# Patient Record
Sex: Female | Born: 1950 | Race: Black or African American | Hispanic: No | Marital: Married | Smoking: Never smoker
Health system: Southern US, Community
[De-identification: ages and names within clinical notes are randomized; demographics above are authoritative.]

## PROBLEM LIST (undated history)

## (undated) DIAGNOSIS — E785 Hyperlipidemia, unspecified: Secondary | ICD-10-CM

## (undated) DIAGNOSIS — I10 Essential (primary) hypertension: Secondary | ICD-10-CM

## (undated) DIAGNOSIS — E119 Type 2 diabetes mellitus without complications: Secondary | ICD-10-CM

---

## 2004-11-04 ENCOUNTER — Emergency Department: Payer: Self-pay | Admitting: Emergency Medicine

## 2004-11-04 ENCOUNTER — Other Ambulatory Visit: Payer: Self-pay

## 2005-05-29 ENCOUNTER — Encounter: Payer: Self-pay | Admitting: Internal Medicine

## 2005-06-06 ENCOUNTER — Encounter: Payer: Self-pay | Admitting: Internal Medicine

## 2005-07-23 ENCOUNTER — Other Ambulatory Visit: Payer: Self-pay

## 2005-07-23 ENCOUNTER — Inpatient Hospital Stay: Payer: Self-pay | Admitting: Infectious Diseases

## 2005-07-25 ENCOUNTER — Other Ambulatory Visit: Payer: Self-pay

## 2005-07-26 ENCOUNTER — Other Ambulatory Visit: Payer: Self-pay

## 2006-03-25 ENCOUNTER — Emergency Department: Payer: Self-pay | Admitting: Emergency Medicine

## 2006-03-29 ENCOUNTER — Encounter: Payer: Self-pay | Admitting: Internal Medicine

## 2006-04-06 ENCOUNTER — Encounter: Payer: Self-pay | Admitting: Internal Medicine

## 2006-06-09 ENCOUNTER — Other Ambulatory Visit: Payer: Self-pay

## 2006-06-09 ENCOUNTER — Emergency Department: Payer: Self-pay | Admitting: Emergency Medicine

## 2006-06-11 ENCOUNTER — Ambulatory Visit: Payer: Self-pay | Admitting: Emergency Medicine

## 2006-08-22 ENCOUNTER — Other Ambulatory Visit: Payer: Self-pay

## 2006-08-22 ENCOUNTER — Emergency Department: Payer: Self-pay | Admitting: Emergency Medicine

## 2008-11-23 ENCOUNTER — Emergency Department: Payer: Self-pay

## 2010-06-11 ENCOUNTER — Emergency Department: Payer: Self-pay | Admitting: Unknown Physician Specialty

## 2010-09-18 ENCOUNTER — Emergency Department: Payer: Self-pay | Admitting: Emergency Medicine

## 2011-06-05 ENCOUNTER — Emergency Department: Payer: Self-pay | Admitting: Emergency Medicine

## 2015-11-20 ENCOUNTER — Emergency Department: Payer: Medicare HMO

## 2015-11-20 ENCOUNTER — Emergency Department
Admission: EM | Admit: 2015-11-20 | Discharge: 2015-11-20 | Disposition: A | Discharge status: Home / Self Care | Payer: Medicare HMO | Attending: Emergency Medicine | Admitting: Emergency Medicine

## 2015-11-20 ENCOUNTER — Encounter: Payer: Self-pay | Admitting: Emergency Medicine

## 2015-11-20 DIAGNOSIS — M5412 Radiculopathy, cervical region: Secondary | ICD-10-CM | POA: Insufficient documentation

## 2015-11-20 DIAGNOSIS — I10 Essential (primary) hypertension: Secondary | ICD-10-CM | POA: Insufficient documentation

## 2015-11-20 DIAGNOSIS — E119 Type 2 diabetes mellitus without complications: Secondary | ICD-10-CM | POA: Diagnosis not present

## 2015-11-20 DIAGNOSIS — M542 Cervicalgia: Secondary | ICD-10-CM | POA: Diagnosis present

## 2015-11-20 HISTORY — DX: Essential (primary) hypertension: I10

## 2015-11-20 HISTORY — DX: Type 2 diabetes mellitus without complications: E11.9

## 2015-11-20 HISTORY — DX: Hyperlipidemia, unspecified: E78.5

## 2015-11-20 LAB — CBC
HCT: 39.2 % (ref 35.0–47.0)
Hemoglobin: 12.9 g/dL (ref 12.0–16.0)
MCH: 29.3 pg (ref 26.0–34.0)
MCHC: 33 g/dL (ref 32.0–36.0)
MCV: 88.7 fL (ref 80.0–100.0)
PLATELETS: 247 10*3/uL (ref 150–440)
RBC: 4.42 MIL/uL (ref 3.80–5.20)
RDW: 14.2 % (ref 11.5–14.5)
WBC: 5.7 10*3/uL (ref 3.6–11.0)

## 2015-11-20 LAB — BASIC METABOLIC PANEL
ANION GAP: 8 (ref 5–15)
BUN: 27 mg/dL — ABNORMAL HIGH (ref 6–20)
CALCIUM: 9.4 mg/dL (ref 8.9–10.3)
CO2: 28 mmol/L (ref 22–32)
Chloride: 106 mmol/L (ref 101–111)
Creatinine, Ser: 0.88 mg/dL (ref 0.44–1.00)
GFR calc Af Amer: >60 mL/min (ref >60)
GLUCOSE: 155 mg/dL — AB (ref 65–99)
Potassium: 3.9 mmol/L (ref 3.5–5.1)
SODIUM: 142 mmol/L (ref 135–145)

## 2015-11-20 LAB — TROPONIN I: Troponin I: <0.03 ng/mL (ref <0.031)

## 2015-11-20 MED ORDER — KETOROLAC TROMETHAMINE 30 MG/ML IJ SOLN
15.0000 mg | Freq: Once | INTRAMUSCULAR | Status: AC
  Administered 2015-11-20: 15 mg via INTRAVENOUS
  Filled 2015-11-20: qty 1

## 2015-11-20 MED ORDER — LORAZEPAM 2 MG/ML IJ SOLN
0.5000 mg | Freq: Once | INTRAMUSCULAR | Status: AC
  Administered 2015-11-20: 0.5 mg via INTRAVENOUS
  Filled 2015-11-20: qty 1

## 2015-11-20 MED ORDER — OXYCODONE-ACETAMINOPHEN 5-325 MG PO TABS: 2.0000 | ORAL_TABLET | Freq: Four times a day (QID) | ORAL | Status: DC | PRN

## 2015-11-20 MED ORDER — DEXAMETHASONE SODIUM PHOSPHATE 10 MG/ML IJ SOLN
10.0000 mg | Freq: Once | INTRAMUSCULAR | Status: AC
  Administered 2015-11-20: 10 mg via INTRAVENOUS
  Filled 2015-11-20: qty 1

## 2015-11-20 MED ORDER — PREDNISONE 10 MG (21) PO TBPK: 10.0000 mg | ORAL_TABLET | Freq: Every day | ORAL | Status: DC

## 2015-11-20 MED ORDER — HYDROMORPHONE HCL 1 MG/ML IJ SOLN
0.5000 mg | Freq: Once | INTRAMUSCULAR | Status: AC
  Administered 2015-11-20: 0.5 mg via INTRAVENOUS
  Filled 2015-11-20: qty 1

## 2015-11-20 MED ORDER — HYDROMORPHONE HCL 1 MG/ML IJ SOLN
0.5000 mg | Freq: Once | INTRAMUSCULAR | Status: AC
  Administered 2015-11-20: 0.5 mg via INTRAVENOUS

## 2015-11-20 NOTE — Discharge Instructions (Signed)

## 2015-11-20 NOTE — ED Provider Notes (Signed)
[facility] Emergency Department Provider Note     Time seen: ----------------------------------------- 8:29 AM on 11/20/2015 -----------------------------------------    I have reviewed the triage vital signs and the nursing notes.   HISTORY  Chief Complaint Arm Pain    HPI Rebecca Fowler is a 65 y.o. female brought to the ER for right arm pain for last month. Patient states he got worse last night when she slept on it reports shortness of breath started this morning. Patient states the pain starts in the right side of her neck and radiates down particularly to her second and third digits on her right hand. Pain increases with movement of the right arm and neck. She also reports pain in the right side of her chest intermittently over the last month. Nothing makes her symptoms better, she seen her primary care doctor who told her was probably radiculopathy.   Past Medical History  Diagnosis Date  . Hyperlipemia   . Diabetes mellitus without complication ([place])   . Hypertension     There are no active problems to display for this patient.   History reviewed. No pertinent past surgical history.  Allergies Review of patient's allergies indicates no known allergies.  Social History Social History  Substance Use Topics  . Smoking status: Never Smoker   . Smokeless tobacco: None  . Alcohol Use: No    Review of Systems Constitutional: Negative for fever. Eyes: Negative for visual changes. ENT: Negative for sore throat. Cardiovascular: Negative for chest pain. Respiratory: Negative for shortness of breath. Gastrointestinal: Negative for abdominal pain, vomiting and diarrhea. Genitourinary: Negative for dysuria. Musculoskeletal: Positive for radicular right arm pain Skin: Negative for rash. Neurological: Negative for headaches, focal weakness or numbness.  10-point ROS otherwise  negative.  ____________________________________________   PHYSICAL EXAM:  VITAL SIGNS: ED Triage Vitals  Enc Vitals Group     BP 11/20/15 0814 154/77 mmHg     Pulse Rate 11/20/15 0814 85     Resp 11/20/15 0814 16     Temp --      Temp src --      SpO2 11/20/15 0814 98 %     Weight --      Height --      Head Cir --      Peak Flow --      Pain Score 11/20/15 0752 9     Pain Loc --      Pain Edu? --      Excl. in [place]? --     Constitutional: Alert and oriented. Well appearing and in no distress. Eyes: Conjunctivae are normal. PERRL. Normal extraocular movements. ENT   Head: Normocephalic and atraumatic.   Nose: No congestion/rhinnorhea.   Mouth/Throat: Mucous membranes are moist.   Neck: No stridor. Cardiovascular: Normal rate, regular rhythm. Normal and symmetric distal pulses are present in all extremities. No murmurs, rubs, or gallops. Respiratory: Normal respiratory effort without tachypnea nor retractions. Breath sounds are clear and equal bilaterally. No wheezes/rales/rhonchi. Gastrointestinal: Soft and nontender. No distention. No abdominal bruits.  Musculoskeletal: Mild pain with range of motion right shoulder, patient describes C7 radiculopathy Neurologic:  Normal speech and language. No gross focal neurologic deficits are appreciated. Speech is normal. No gait instability. Patient describes C7 radiculopathy in the right hand and arm over the dorsal aspect Skin:  Skin is warm, dry and intact. No rash noted. Psychiatric: Mood and affect are normal. Speech and behavior are normal. Patient exhibits appropriate insight and judgment. ____________________________________________  EKG: Interpreted by me. Normal sinus rhythm with a rate of 93 bpm, normal PR interval, normal QRS, normal QT interval. Leftward axis. No evidence of acute infarction  ____________________________________________  ED COURSE:  Pertinent labs & imaging results that were available  during my care of the patient were reviewed by me and considered in my medical decision making (see chart for details). Patient does describe a C7 radiculopathy in the right side. I will evaluate her chest and C-spine. We will also check cardiac labs ____________________________________________    LABS (pertinent positives/negatives)  Labs Reviewed  BASIC METABOLIC PANEL - Abnormal; Notable for the following:    Glucose, Bld 155 (*)    BUN 27 (*)    All other components within normal limits  TROPONIN I  CBC    RADIOLOGY Images were viewed by me  Chest x-ray, C-spine CT  IMPRESSION: 1. Negative for fracture or other acute bone abnormality. 2. Loss of the normal cervical spine lordosis, which may be secondary to positioning, spasm, or soft tissue injury. 3. Multilevel spondylitic changes and disc protrusions as above. 4. Nonspecific lucent lesions in several vertebral bodies. Correlate with any clinical or laboratory evidence of multiple myeloma. 5. Thyroid nodules, incompletely characterized. 6. Bilateral carotid bifurcation plaque. IMPRESSION: No active cardiopulmonary disease. ____________________________________________  FINAL ASSESSMENT AND PLAN  Cervical radiculopathy  Plan: Patient with labs and imaging as dictated above. Patient is feeling better after pain medicine and steroids here. CT of the C-spine does show multilevel degenerative disc disease. She will need to follow-up with her doctor to have an MRI. We'll place her on steroid taper given Percocet for pain. She is advised to use Colace to prevent constipation.   Earleen Newport, MD   Earleen Newport, MD 11/20/15 [phone]

## 2015-11-20 NOTE — ED Notes (Addendum)
Pt to ed with c/o right arm pain x 1 month.  Pt states it got worse last night after she slept on it and reports sob that started this am.  Pt states pain starts in right neck and radiates down into right hand with numbness in right hand. Pt reports increased pain with movement of right arm or neck.  Also reports pain in right side of chest intermittently over the last month.

## 2021-10-25 ENCOUNTER — Other Ambulatory Visit: Payer: Self-pay

## 2021-10-25 ENCOUNTER — Emergency Department: Payer: Medicare PPO

## 2021-10-25 ENCOUNTER — Emergency Department
Admission: EM | Admit: 2021-10-25 | Discharge: 2021-10-25 | Disposition: A | Discharge status: Home / Self Care | Payer: Medicare PPO | Attending: Emergency Medicine | Admitting: Emergency Medicine

## 2021-10-25 ENCOUNTER — Encounter: Payer: Self-pay | Admitting: Emergency Medicine

## 2021-10-25 ENCOUNTER — Emergency Department (HOSPITAL_COMMUNITY): Payer: Medicare HMO

## 2021-10-25 DIAGNOSIS — E119 Type 2 diabetes mellitus without complications: Secondary | ICD-10-CM | POA: Diagnosis not present

## 2021-10-25 DIAGNOSIS — M5431 Sciatica, right side: Secondary | ICD-10-CM | POA: Diagnosis not present

## 2021-10-25 DIAGNOSIS — Z7982 Long term (current) use of aspirin: Secondary | ICD-10-CM | POA: Diagnosis not present

## 2021-10-25 DIAGNOSIS — M549 Dorsalgia, unspecified: Secondary | ICD-10-CM | POA: Diagnosis present

## 2021-10-25 DIAGNOSIS — I10 Essential (primary) hypertension: Secondary | ICD-10-CM | POA: Diagnosis not present

## 2021-10-25 DIAGNOSIS — Z79899 Other long term (current) drug therapy: Secondary | ICD-10-CM | POA: Insufficient documentation

## 2021-10-25 DIAGNOSIS — Z7984 Long term (current) use of oral hypoglycemic drugs: Secondary | ICD-10-CM | POA: Diagnosis not present

## 2021-10-25 MED ORDER — DEXAMETHASONE SODIUM PHOSPHATE 10 MG/ML IJ SOLN
10.0000 mg | Freq: Once | INTRAMUSCULAR | Status: AC
  Administered 2021-10-25: 22:00:00 10 mg via INTRAMUSCULAR
  Filled 2021-10-25: qty 1

## 2021-10-25 MED ORDER — METHOCARBAMOL 500 MG PO TABS
500.0000 mg | ORAL_TABLET | Freq: Four times a day (QID) | ORAL | 0 refills | Status: DC
Start: 2021-10-25 — End: 2021-10-28

## 2021-10-25 MED ORDER — PREDNISONE 10 MG PO TABS: 10.0000 mg | ORAL_TABLET | ORAL | 0 refills | Status: AC

## 2021-10-25 MED ORDER — ORPHENADRINE CITRATE 30 MG/ML IJ SOLN
60.0000 mg | Freq: Once | INTRAMUSCULAR | Status: AC
  Administered 2021-10-25: 22:00:00 60 mg via INTRAMUSCULAR
  Filled 2021-10-25: qty 2

## 2021-10-25 NOTE — ED Triage Notes (Signed)
Pt to ED via POV with c/o right lower back pain that radiates down to her right knee. It just began hurting before she came, denies any injury.

## 2021-10-25 NOTE — ED Provider Notes (Signed)
[facility] Emergency Department Provider Note  ____________________________________________  Time seen: Approximately 10:11 PM  I have reviewed the triage vital signs and the nursing notes.   HISTORY  Chief Complaint Back Pain and Leg Pain    HPI Rebecca DICKERSON is a 70 y.o. female who presents to the emergency department complaining of radiating pain from her right back.  She does have a history of back issues, states that she feels that she may have sciatica.  No recent trauma.  No urinary or GI complaints.  Patient denies any bowel or bladder dysfunction, saddle anesthesia or paresthesias.  Not alleviated with over-the-counter medications at home.       Past Medical History:  Diagnosis Date   Diabetes mellitus without complication (HCC)    Hyperlipemia    Hypertension     There are no problems to display for this patient.   History reviewed. No pertinent surgical history.  Prior to Admission medications   Medication Sig Start Date End Date Taking? Authorizing Provider  methocarbamol (ROBAXIN) 500 MG tablet Take 1 tablet (500 mg total) by mouth 4 (four) times daily. 10/25/21  Yes Jaleyah Longhi, Delorise Royals, PA-C  predniSONE (DELTASONE) 10 MG tablet Take 1 tablet (10 mg total) by mouth as directed. 10/25/21  Yes Shaaron Golliday, Delorise Royals, PA-C  amLODipine (NORVASC) 10 MG tablet Take 10 mg by mouth daily. 12/28/14   [provider]  aspirin 81 MG chewable tablet Chew 81 mg by mouth every morning.    [provider]  atorvastatin (LIPITOR) 10 MG tablet Take 10 mg by mouth 4 (four) times a week. 11/18/15   [provider]  chlorthalidone (HYGROTON) 25 MG tablet Take 25 mg by mouth every morning. 10/12/15   [provider]  gabapentin (NEURONTIN) 100 MG capsule Take 300 mg by mouth every evening. 11/08/15   [provider]  glimepiride (AMARYL) 4 MG tablet Take 4 mg by mouth 2 (two) times daily. 10/12/15   [provider]  hydrOXYzine (ATARAX/VISTARIL) 25 MG tablet Take 25 mg by mouth every 6 (six) hours as needed. 10/20/15   [provider]  losartan (COZAAR) 100 MG tablet Take 100 mg by mouth daily. 03/09/15   [provider]  metFORMIN (GLUCOPHAGE) 1000 MG tablet Take 1,000 mg by mouth 2 (two) times daily with a meal. 09/21/15   [provider]  metoprolol succinate (TOPROL-XL) 25 MG 24 hr tablet Take 25 mg by mouth daily. 09/21/15   [provider]  naproxen (NAPROSYN) 500 MG tablet Take 500 mg by mouth 2 (two) times daily as needed. For pain. 06/15/15   [provider]  oxyCODONE-acetaminophen (PERCOCET) 5-325 MG tablet Take 2 tablets by mouth every 6 (six) hours as needed for moderate pain or severe pain. 11/20/15   Emily Filbert, MD  traMADol (ULTRAM) 50 MG tablet Take 50 mg by mouth every 6 (six) hours as needed. For pain. 11/11/15   [provider]    Allergies Pravastatin and Simvastatin  No family history on file.  Social History Social History   Tobacco Use   Smoking status: Never  Substance Use Topics   Alcohol use: No   Drug use: No     Review of Systems  Constitutional: No fever/chills Eyes: No visual changes. No discharge ENT: No upper respiratory complaints. Cardiovascular: no chest pain. Respiratory: no cough. No SOB. Gastrointestinal: No abdominal pain.  No nausea, no vomiting.  No diarrhea.  No constipation. Musculoskeletal: Right lower  back pain radiating into right leg Skin: Negative for rash, abrasions, lacerations, ecchymosis. Neurological: Negative for headaches, focal weakness or numbness.  10 System ROS otherwise negative.  ____________________________________________   PHYSICAL EXAM:  VITAL SIGNS: ED Triage Vitals [10/25/21 2113]  Enc Vitals Group     BP (!) 132/58     Pulse Rate 81     Resp 20     Temp 98.9 F (37.2 C)     Temp Source Oral     SpO2 100 %     Weight 248 lb (112.5 kg)      Height 5' 7.5" (1.715 m)     Head Circumference      Peak Flow      Pain Score 10     Pain Loc      Pain Edu?      Excl. in [place]?      Constitutional: Alert and oriented. Well appearing and in no acute distress. Eyes: Conjunctivae are normal. PERRL. EOMI. Head: Atraumatic. ENT:      Ears:       Nose: No congestion/rhinnorhea.      Mouth/Throat: Mucous membranes are moist.  Neck: No stridor.    Cardiovascular: Normal rate, regular rhythm. Normal S1 and S2.  Good peripheral circulation. Respiratory: Normal respiratory effort without tachypnea or retractions. Lungs CTAB. Good air entry to the bases with no decreased or absent breath sounds. Musculoskeletal: Full range of motion to all extremities. No gross deformities appreciated.  No midline tenderness, patient is tender along the right paraspinal muscle group extending into the SI joint and sciatic notch.  No palpable abnormalities.  Examination of the knee and ankle is unremarkable.  Dorsalis pedis pulses sensation intact distally Neurologic:  Normal speech and language. No gross focal neurologic deficits are appreciated.  Skin:  Skin is warm, dry and intact. No rash noted. Psychiatric: Mood and affect are normal. Speech and behavior are normal. Patient exhibits appropriate insight and judgement.   ____________________________________________   LABS (all labs ordered are listed, but only abnormal results are displayed)  Labs Reviewed - No data to display ____________________________________________  EKG   ____________________________________________  RADIOLOGY I personally viewed and evaluated these images as part of my medical decision making, as well as reviewing the written report by the radiologist.  ED Provider Interpretation: Degenerative changes without acute findings  DG Lumbar Spine 2-3 Views  Result Date: 10/25/2021 CLINICAL DATA:  Lower back pain that radiates to the right knee. EXAM: LUMBAR SPINE - 2-3  VIEW COMPARISON:  None. FINDINGS: There is no evidence of lumbar spine fracture. Approximately 5 mm anterolisthesis of the L4 vertebral body is seen on L5. Mild multilevel endplate sclerosis is seen. Bilateral facet joint hypertrophy is seen, most prominent at the levels of L4-L5 and L5-S1. Intervertebral disc spaces are maintained. IMPRESSION: 1. 5 mm anterolisthesis of L4 on L5. 2. Multilevel degenerative changes, most prominent at L4-L5 and L5-S1. Electronically Signed   By: Aram Candela M.D.   On: 10/25/2021 21:51    ____________________________________________    PROCEDURES  Procedure(s) performed:    Procedures    Medications  dexamethasone (DECADRON) injection 10 mg (10 mg Intramuscular Given 10/25/21 2218)  orphenadrine (NORFLEX) injection 60 mg (60 mg Intramuscular Given 10/25/21 2218)     ____________________________________________   INITIAL IMPRESSION / ASSESSMENT AND PLAN / ED COURSE  Pertinent labs & imaging results that were available during my care of the patient were reviewed by me and considered in my medical decision  making (see chart for details).  Review of the  [place]RS was performed in accordance of the [facility] prior to dispensing any controlled drugs.           Patient's diagnosis is consistent with right-sided sciatica.  Patient presents emergency department with right lower back pain radiating down the right leg.  Patient has had no trauma.  No urinary or GI complaints.  Tender on exam along the paraspinal muscle group extending into the SI joint and sciatic notch consistent with sciatica.  Patient will be given steroids and muscle relaxer for symptom relief.  Follow-up primary care as needed.  Return precautions discussed with the patient.. Patient is given ED precautions to return to the ED for any worsening or new symptoms.     ____________________________________________  FINAL CLINICAL IMPRESSION(S) / ED DIAGNOSES  Final diagnoses:  Sciatica  of right side      NEW MEDICATIONS STARTED DURING THIS VISIT:  ED Discharge Orders          Ordered    predniSONE (DELTASONE) 10 MG tablet  As directed       Note to Pharmacy: Take on a pattern of 6, 6, 5, 5, 4, 4, 3, 3, 2, 2, 1, 1   10/25/21 2211    methocarbamol (ROBAXIN) 500 MG tablet  4 times daily        10/25/21 2211                This chart was dictated using voice recognition software/Dragon. Despite best efforts to proofread, errors can occur which can change the meaning. Any change was purely unintentional.    Racheal Patches, PA-C 10/25/21 2355    Minna Antis, MD 10/26/21 2258

## 2021-10-28 ENCOUNTER — Emergency Department: Payer: Medicare PPO

## 2021-10-28 ENCOUNTER — Emergency Department
Admission: EM | Admit: 2021-10-28 | Discharge: 2021-10-28 | Disposition: A | Discharge status: Home / Self Care | Payer: Medicare PPO | Attending: Emergency Medicine | Admitting: Emergency Medicine

## 2021-10-28 ENCOUNTER — Other Ambulatory Visit: Payer: Self-pay

## 2021-10-28 ENCOUNTER — Encounter: Payer: Self-pay | Admitting: Emergency Medicine

## 2021-10-28 DIAGNOSIS — Z7984 Long term (current) use of oral hypoglycemic drugs: Secondary | ICD-10-CM | POA: Diagnosis not present

## 2021-10-28 DIAGNOSIS — Z79899 Other long term (current) drug therapy: Secondary | ICD-10-CM | POA: Diagnosis not present

## 2021-10-28 DIAGNOSIS — E119 Type 2 diabetes mellitus without complications: Secondary | ICD-10-CM | POA: Diagnosis not present

## 2021-10-28 DIAGNOSIS — I10 Essential (primary) hypertension: Secondary | ICD-10-CM | POA: Insufficient documentation

## 2021-10-28 DIAGNOSIS — Z7982 Long term (current) use of aspirin: Secondary | ICD-10-CM | POA: Diagnosis not present

## 2021-10-28 DIAGNOSIS — M5441 Lumbago with sciatica, right side: Secondary | ICD-10-CM | POA: Diagnosis present

## 2021-10-28 MED ORDER — OXYCODONE-ACETAMINOPHEN 5-325 MG PO TABS: 1.0000 | ORAL_TABLET | ORAL | 0 refills | Status: AC | PRN

## 2021-10-28 MED ORDER — LIDOCAINE 5 % EX PTCH
1.0000 | MEDICATED_PATCH | CUTANEOUS | Status: DC
  Administered 2021-10-28: 17:00:00 1 via TRANSDERMAL
  Filled 2021-10-28: qty 1

## 2021-10-28 MED ORDER — OXYCODONE-ACETAMINOPHEN 5-325 MG PO TABS
1.0000 | ORAL_TABLET | Freq: Once | ORAL | Status: DC
  Filled 2021-10-28: qty 1

## 2021-10-28 MED ORDER — FENTANYL CITRATE PF 50 MCG/ML IJ SOSY
50.0000 ug | PREFILLED_SYRINGE | Freq: Once | INTRAMUSCULAR | Status: AC
  Administered 2021-10-28: 17:00:00 50 ug via INTRAMUSCULAR
  Filled 2021-10-28: qty 1

## 2021-10-28 MED ORDER — BACLOFEN 10 MG PO TABS: 10.0000 mg | ORAL_TABLET | Freq: Three times a day (TID) | ORAL | 0 refills | Status: AC

## 2021-10-28 MED ORDER — LIDOCAINE 5 % EX PTCH: 1.0000 | MEDICATED_PATCH | Freq: Two times a day (BID) | CUTANEOUS | 0 refills | Status: AC

## 2021-10-28 NOTE — Discharge Instructions (Signed)
You have nerve root impingement of L5/S1. The back specialist will call you for an appointment next week, if you do not hear from his office by Wednesday please call them at the number listed on your papers Take the pain medication as needed Return to the ER if worsening Finish the steroid pack, stop the robaxin,  Baclofen for muscle relaxer, percocet for pain, continue your other regular medications Apply ice to the lower back

## 2021-10-28 NOTE — ED Notes (Signed)
Pt and granddaughter state that ordered percocet was already given in triage.  Pt to ED for R sided sharp, shooting 8/10 pain from mid R back down R leg. States has had same before on L side.  Pt moved to bed, purewick placed. Strong odor to urine noted.

## 2021-10-28 NOTE — ED Provider Notes (Signed)
Emergency Medicine Provider Triage Evaluation Note  Rebecca Fowler , a 70 y.o. female  was evaluated in triage.  Pt complains of increasing low back pain.  Pain radiates to the right leg and into the foot.  Seen here on 12/21.  Has already been taking gabapentin but was given Sterapred and Robaxin.  No relief with these medications..  Review of Systems  Positive: Right-sided low back pain radiates to the foot Negative: Abdominal pain, vomiting, diarrhea, chest pain or shortness of breath, no known injury  Physical Exam  BP (!) 115/46 (BP Location: Left Arm)    Pulse 72    Temp 98.2 F (36.8 C) (Oral)    Resp 20    Ht 5\' 7"  (1.702 m)    Wt 112.5 kg    SpO2 98%    BMI 38.84 kg/m  Gen:   Awake, no distress   Resp:  Normal effort  MSK:   Moves extremities without difficulty, 5/5 strength in the toes bilaterally Other:    Medical Decision Making  Medically screening exam initiated at 12:31 PM.  Appropriate orders placed.  was informed that the remainder of the evaluation will be completed by another provider, this initial triage assessment does not replace that evaluation, and the importance of remaining in the ED until their evaluation is complete.  Due to patient returning with no relief with medications, will order MRI   Ayesha Mohair, PA-C 10/28/21 1233    10/30/21, MD 10/28/21 [phone]

## 2021-10-28 NOTE — ED Provider Notes (Signed)
°  Physical Exam  BP (!) 115/46 (BP Location: Left Arm)    Pulse 72    Temp 98.2 F (36.8 C) (Oral)    Resp 20    Ht 5\' 7"  (1.702 m)    Wt 112.5 kg    SpO2 98%    BMI 38.84 kg/m   Physical Exam  ED Course/Procedures     Procedures  MDM  Assuming patient care from , PA-C at shift change.  Plan at this time is to await MRI results and evaluate for pain control.  Patient had been seen previously in the ED for the same back pain and had not had any relief.  I did a MSE on her in triage and ordered MRI  Patient be given fentanyl 50 mcg IM  MRI reviewed by me confirmed by radiology to have some impingement of the L5/S1 nerve root.  Other facet arthropathies and bulging disks also noted.  Consult to neurosurgery.  Dr. Bridget Hartshorn would like to see the patient in the office.  Recommend steroids.  Patient did have some relief with the fentanyl IM.  Lidoderm patch was placed on her lower back.  She is in agreement with the treatment plan to follow-up with Dr. Marcell Barlow.  She was given a prescription for baclofen, Percocet, and Lidoderm patches.  She is to finish her steroid pack.  Stop the Robaxin.  She was discharged in stable condition in the care of her husband     Marcell Barlow 10/28/21 1725    10/30/21, MD 10/28/21 [phone]

## 2021-10-28 NOTE — ED Provider Notes (Signed)
[facility] Emergency Department Provider Note  ____________________________________________   Event Date/Time   First MD Initiated Contact with Patient 10/28/21 1433     (approximate)  I have reviewed the triage vital signs and the nursing notes.   HISTORY  Chief Complaint Back Pain   HPI Rebecca Fowler is a 70 y.o. female presents to the ED with complaint of low back pain with right lower extremity sciatica.  Patient presents to the ED via EMS as she was unable to walk to the car.  She states that her husband is with her.  She states currently she has been taking gabapentin for the last 3 days without any relief of her pain.  She has a history of sciatica on the left.  Patient denies any recent falls or injuries.  Rates her pain as 10/10.         Past Medical History:  Diagnosis Date   Diabetes mellitus without complication (HCC)    Hyperlipemia    Hypertension     There are no problems to display for this patient.   History reviewed. No pertinent surgical history.  Prior to Admission medications   Medication Sig Start Date End Date Taking? Authorizing Provider  amLODipine (NORVASC) 10 MG tablet Take 10 mg by mouth daily. 12/28/14   [provider]  aspirin 81 MG chewable tablet Chew 81 mg by mouth every morning.    [provider]  atorvastatin (LIPITOR) 10 MG tablet Take 10 mg by mouth 4 (four) times a week. 11/18/15   [provider]  chlorthalidone (HYGROTON) 25 MG tablet Take 25 mg by mouth every morning. 10/12/15   [provider]  gabapentin (NEURONTIN) 100 MG capsule Take 300 mg by mouth every evening. 11/08/15   [provider]  glimepiride (AMARYL) 4 MG tablet Take 4 mg by mouth 2 (two) times daily. 10/12/15   [provider]  hydrOXYzine (ATARAX/VISTARIL) 25 MG tablet Take 25 mg by mouth every 6 (six) hours as needed. 10/20/15   [provider]  losartan (COZAAR) 100 MG  tablet Take 100 mg by mouth daily. 03/09/15   [provider]  metFORMIN (GLUCOPHAGE) 1000 MG tablet Take 1,000 mg by mouth 2 (two) times daily with a meal. 09/21/15   [provider]  methocarbamol (ROBAXIN) 500 MG tablet Take 1 tablet (500 mg total) by mouth 4 (four) times daily. 10/25/21   Cuthriell, Delorise Royals, PA-C  metoprolol succinate (TOPROL-XL) 25 MG 24 hr tablet Take 25 mg by mouth daily. 09/21/15   [provider]  naproxen (NAPROSYN) 500 MG tablet Take 500 mg by mouth 2 (two) times daily as needed. For pain. 06/15/15   [provider]  oxyCODONE-acetaminophen (PERCOCET) 5-325 MG tablet Take 2 tablets by mouth every 6 (six) hours as needed for moderate pain or severe pain. 11/20/15   Emily Filbert, MD  predniSONE (DELTASONE) 10 MG tablet Take 1 tablet (10 mg total) by mouth as directed. 10/25/21   Cuthriell, Delorise Royals, PA-C  traMADol (ULTRAM) 50 MG tablet Take 50 mg by mouth every 6 (six) hours as needed. For pain. 11/11/15   [provider]    Allergies Pravastatin and Simvastatin  History reviewed. No pertinent family history.  Social History Social History   Tobacco Use   Smoking status: Never  Substance Use Topics   Alcohol use: No   Drug use: No    Review of Systems Constitutional: No fever/chills Eyes: No visual changes.  Cardiovascular: Denies chest pain. Respiratory: Denies shortness of breath. Gastrointestinal: No abdominal pain.  No nausea, no vomiting.  No diarrhea.   Genitourinary: Negative for dysuria. Musculoskeletal: positive for right sided low back pain with radiculopathy. Skin: Negative for rash. Neurological: Negative for headaches, focal weakness or numbness. ____________________________________________   PHYSICAL EXAM:  VITAL SIGNS: ED Triage Vitals  Enc Vitals Group     BP 10/28/21 1230 (!) 115/46     Pulse Rate 10/28/21 1230 72     Resp 10/28/21 1230 20     Temp 10/28/21 1230 98.2 F (36.8 C)      Temp Source 10/28/21 1230 Oral     SpO2 10/28/21 1230 98 %     Weight 10/28/21 1229 248 lb (112.5 kg)     Height 10/28/21 1229 5\' 7"  (1.702 m)     Head Circumference --      Peak Flow --      Pain Score 10/28/21 1228 10     Pain Loc --      Pain Edu? --      Excl. in GC? --     Constitutional: Alert and oriented. Well appearing and in no acute distress.  Patient is currently in a recliner and range of motion is restricted.  There is moderate tenderness on palpation of the right lower lumbar area. Eyes: Conjunctivae are normal.  Head: Atraumatic. Neck: No stridor.   Cardiovascular: Normal rate, regular rhythm. Grossly normal heart sounds.  Good peripheral circulation. Respiratory: Normal respiratory effort.  No retractions. Lungs CTAB. Gastrointestinal: Soft and nontender. No distention.  Musculoskeletal: Visualization of the lower back is difficult secondary to patient being in a recliner and range of motion increasing her pain.  Patient is tender on the right lower lumbar area.  Range of motion increases her pain.  Straight leg raises on the left right approximately 40 degrees without difficulty.  Right leg 20 degrees with pain in the right lower back.  Patient is able to flex and extend from the ankles down without any difficulty.  Motor sensory function intact.  Pulses are present bilaterally. Neurologic:  Normal speech and language. No gross focal neurologic deficits are appreciated.  It was not tested due to patient's pain. Skin:  Skin is warm, dry and intact. No rash noted. Psychiatric: Mood and affect are normal. Speech and behavior are normal.  ____________________________________________   LABS (all labs ordered are listed, but only abnormal results are displayed)  Labs Reviewed - No data to display ____________________________________________  RADIOLOGY I, 10/30/21, personally viewed and evaluated these images (plain radiographs) as part of my medical decision  making, as well as reviewing the written report by the radiologist.  ED MD interpretation:    Official radiology report(s): No results found.  ____________________________________________   PROCEDURES  Procedure(s) performed (including Critical Care):  Procedures   ____________________________________________   INITIAL IMPRESSION / ASSESSMENT AND PLAN / ED COURSE  As part of my medical decision making, I reviewed the following data within the electronic MEDICAL RECORD NUMBER Notes from prior ED visits and [place] Controlled Substance Database  ----------------------------------------- 3:30 PM on 10/28/2021 ----------------------------------------- 70 year old female presents to the ED with complaint of right low back pain with radiculopathy into her right lower extremity.  Patient is already been seen once and evaluated.  At that time patient was placed on steroids and currently is taking gabapentin without any relief.  Patient is still waiting to go to MRI.  This patient is being turned  over to Greig Right who is aware of patient's history and physical exam.   ____________________________________________   FINAL CLINICAL IMPRESSION(S) / ED DIAGNOSES  Final diagnoses:  Right-sided low back pain with right-sided sciatica, unspecified chronicity     ED Discharge Orders     None        Note:  This document was prepared using Dragon voice recognition software and may include unintentional dictation errors.    Tommi Rumps, PA-C 10/28/21 1532    Chesley Noon, MD 10/29/21 [phone]

## 2021-10-28 NOTE — ED Triage Notes (Signed)
Pt reports was seen here this week and dx'd with sciatica and the meds prescribed are not helping

## 2021-10-28 NOTE — ED Triage Notes (Signed)
Pt in via EMS from home with c/o sciatic pain on the right side. Pt was given gabapentin and has been taking it since Tuesday with no relief.

## 2022-05-01 IMAGING — MR MR LUMBAR SPINE W/O CM
5 series · 30 of 48 positions shown · non-contrast
Comparison: Lumbar spine radiograph 10/25/2021

CLINICAL DATA: Lumbar radiculopathy, symptoms persist with > 6 wks
treatment

EXAM:
MRI LUMBAR SPINE WITHOUT CONTRAST
TECHNIQUE: Multiplanar, multisequence MR imaging of the lumbar spine was
performed. No intravenous contrast was administered.

[Series 5: T2 · sagittal · 4.0mm · 0.81mm/px · 6 of 19 slices shown (1 of 2)]
[im 1/19]
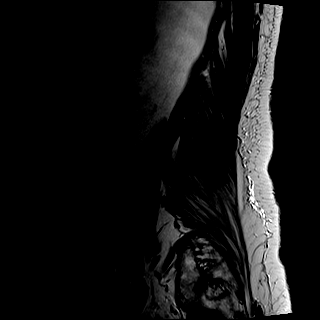
[im 4/19]
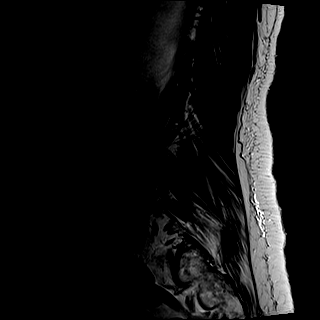
[im 8/19]
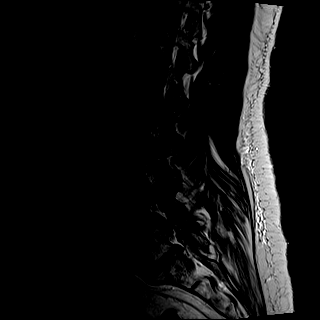
[im 11/19]
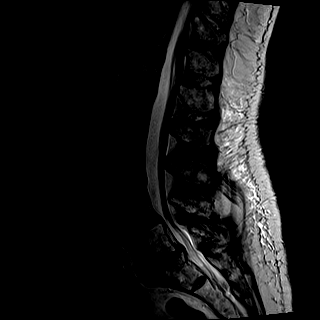
[im 15/19]
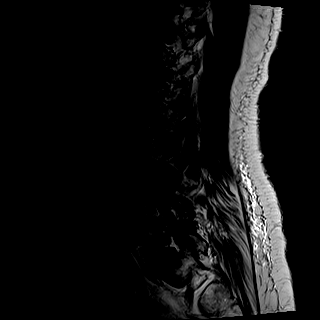
[im 19/19]
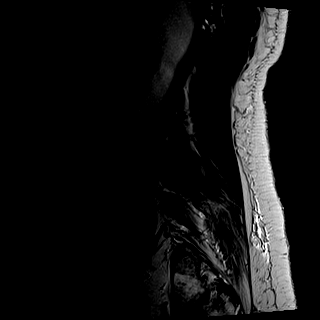

[Series 6: T1 · sagittal · 4.0mm · 0.81mm/px · 7 of 19 slices shown (1 of 2)]
[im 1/19]
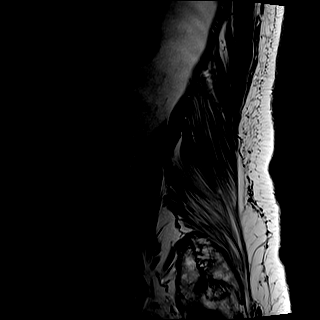
[im 4/19]
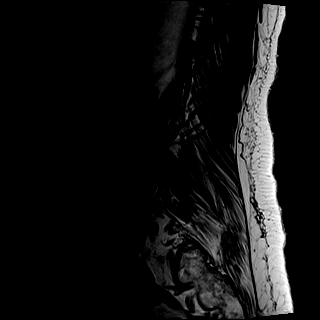
[im 7/19]
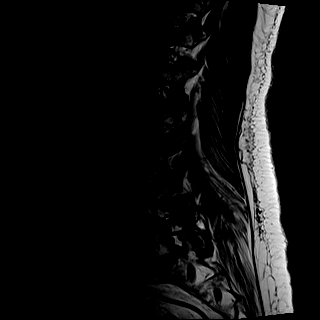
[im 10/19]
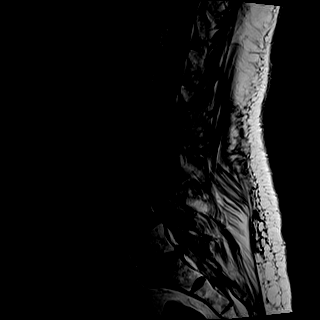
[im 13/19]
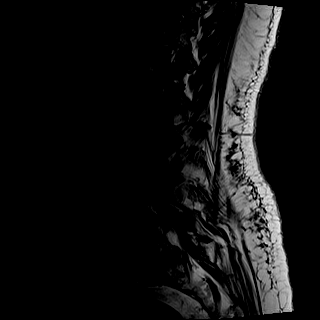
[im 16/19]
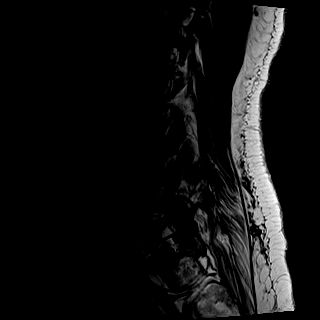
[im 19/19]
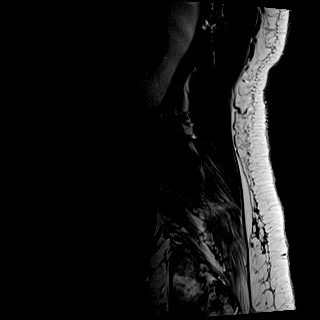

[Series 7: STIR · sagittal · 4.0mm · 0.41mm/px · 1 of 19 slices shown]
[im 1/19]
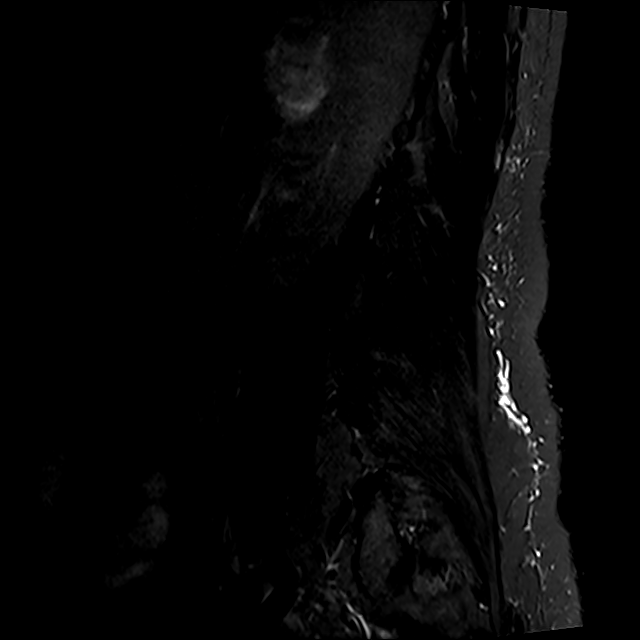

[Series 8: T2 · axial · 4.0mm · 0.78mm/px · z∈[-41,+186]mm · 8 of 37 slices shown (2 of 2)]
[im 1/37]
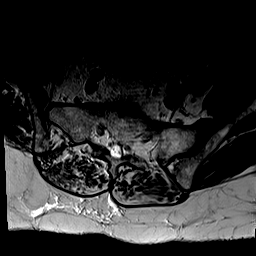
[im 6/37]
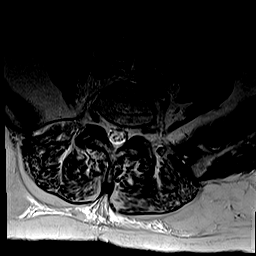
[im 12/37]
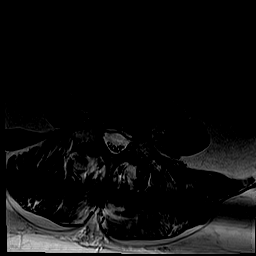
[im 17/37]
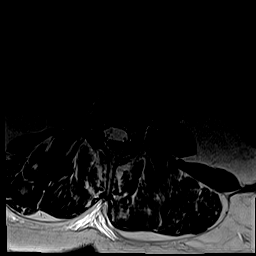
[im 20/37]
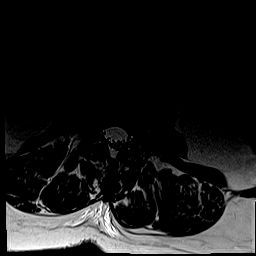
[im 25/37]
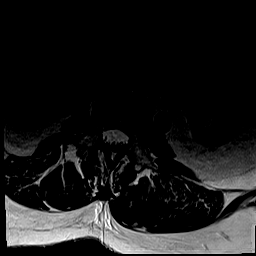
[im 31/37]
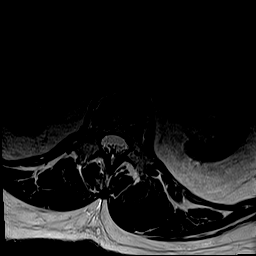
[im 37/37]
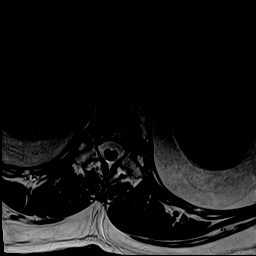

[Series 9: T1 · axial · 4.0mm · 0.39mm/px · z∈[-41,+186]mm · 8 of 37 slices shown (2 of 2)]
[im 1/37]
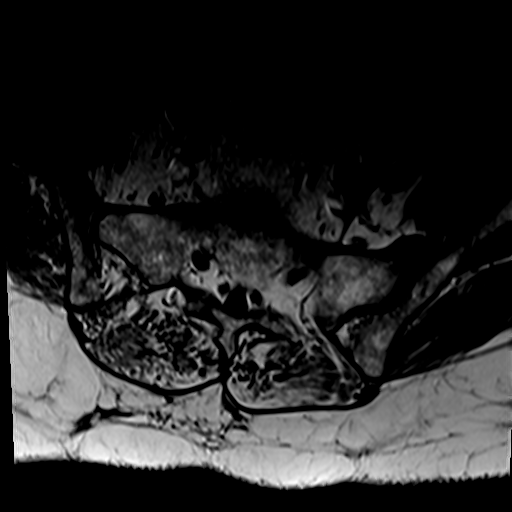
[im 6/37]
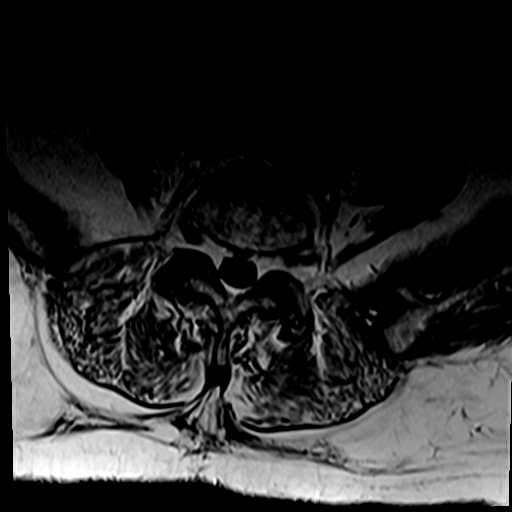
[im 12/37]
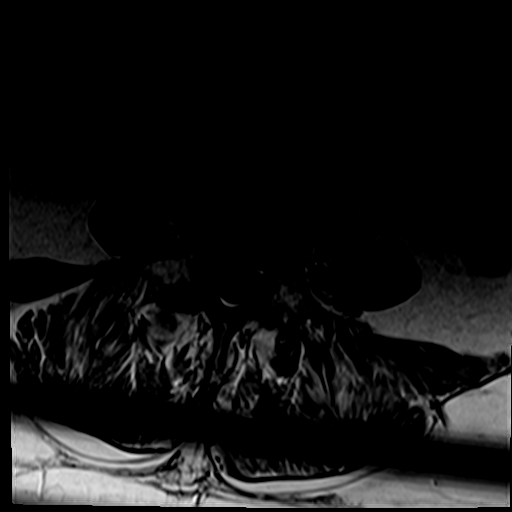
[im 17/37]
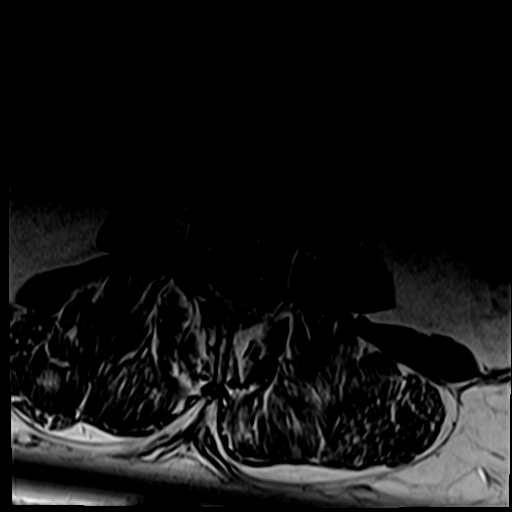
[im 20/37]
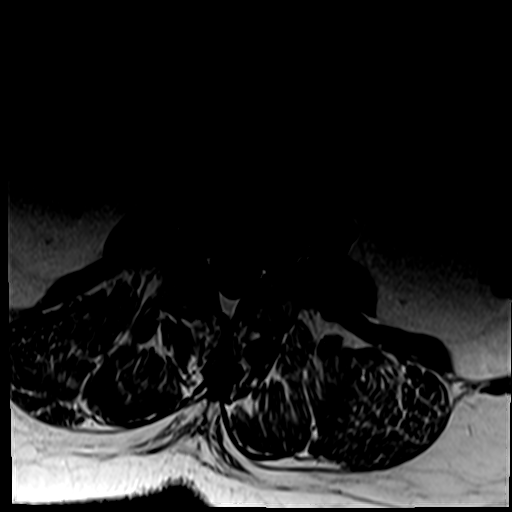
[im 25/37]
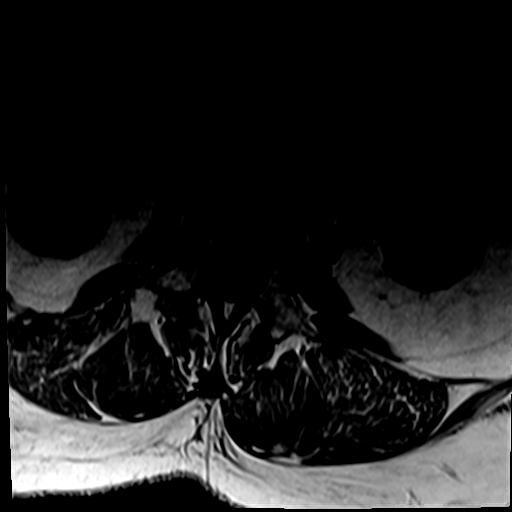
[im 31/37]
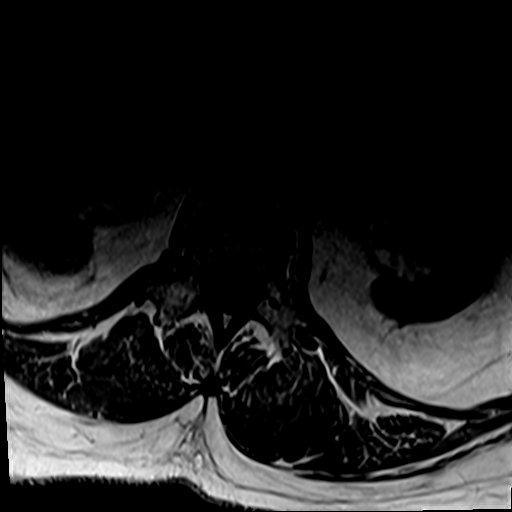
[im 37/37]
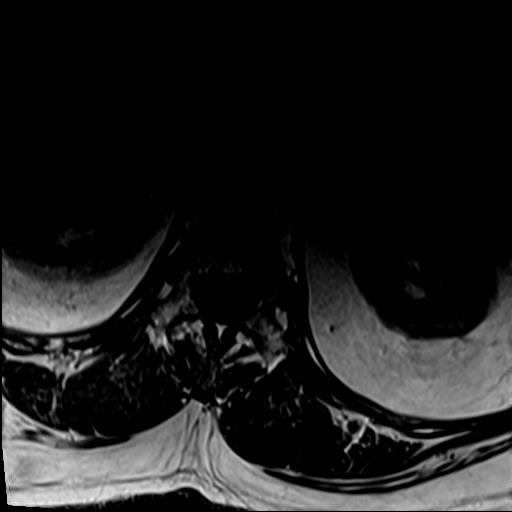

[30 of 48 positions shown; findings below may reference images not displayed]

FINDINGS: Segmentation:  Standard

Alignment:  Grade 1 anterolisthesis at L4-L5.

Vertebrae: No evidence of acute fracture, discitis, or aggressive
osseous lesion.

Conus medullaris and cauda equina: Conus extends to the L1 level.
Conus and cauda equina appear normal.

Paraspinal and other soft tissues: Mild paraspinal muscle atrophy.

Disc levels:

T11-T12: No significant spinal canal or neural foraminal narrowing.

T12-L1: No significant spinal canal or neural foraminal narrowing.

L1-L2: No significant spinal canal or neural foraminal narrowing.

L2-L3: No significant spinal canal or neural foraminal narrowing.

L3-L4: Mild disc bulging and facet arthropathy bilaterally. No
significant stenosis.

L4-L5: Grade 1 anterolisthesis with mild disc bulging, ligamentum
flavum hypertrophy and severe bilateral facet arthropathy results in
moderate spinal canal stenosis, and mild-to-moderate bilateral
neural foraminal narrowing. Right-sided facet effusion.

L5-S1: Broad-based disc bulging, ligamentum flavum hypertrophy and
bilateral facet arthropathy results in moderate spinal canal
stenosis and mild-moderate neural foraminal narrowing. There is
abutment of the descending right S1 nerve root.
IMPRESSION: Multilevel degenerative changes of the lumbar spine, most prominent
at L4-L5 and L5-S1.

L4-L5: Moderate spinal canal stenosis and mild-to-moderate bilateral
neural foraminal narrowing with encroachment of the exiting nerve
roots. Degenerative grade 1 anterolisthesis and severe bilateral
facet arthropathy at this level.

L5-S1: Moderate spinal canal stenosis and mild-to-moderate bilateral
neural foraminal narrowing with encroachment of the exiting nerve
roots. Abutment and potential impingement of the descending right S1
nerve root.

## 2022-08-11 ENCOUNTER — Other Ambulatory Visit: Payer: Self-pay

## 2022-08-11 ENCOUNTER — Ambulatory Visit
Admission: EM | Admit: 2022-08-11 | Discharge: 2022-08-11 | Disposition: A | Discharge status: Home / Self Care | Payer: Medicare PPO | Attending: Family Medicine | Admitting: Family Medicine

## 2022-08-11 ENCOUNTER — Encounter: Payer: Self-pay | Admitting: Emergency Medicine

## 2022-08-11 DIAGNOSIS — U071 COVID-19: Secondary | ICD-10-CM | POA: Diagnosis not present

## 2022-08-11 LAB — RESP PANEL BY RT-PCR (FLU A&B, COVID) ARPGX2
Influenza A by PCR: NEGATIVE
Influenza B by PCR: NEGATIVE
SARS Coronavirus 2 by RT PCR: POSITIVE — AB

## 2022-08-11 MED ORDER — PROMETHAZINE-DM 6.25-15 MG/5ML PO SYRP: 5.0000 mL | ORAL_SOLUTION | Freq: Four times a day (QID) | ORAL | 0 refills | Status: AC | PRN

## 2022-08-11 MED ORDER — ALBUTEROL SULFATE HFA 108 (90 BASE) MCG/ACT IN AERS: 2.0000 | INHALATION_SPRAY | RESPIRATORY_TRACT | 0 refills | Status: AC | PRN

## 2022-08-11 MED ORDER — MOLNUPIRAVIR 200 MG PO CAPS: 4.0000 | ORAL_CAPSULE | Freq: Two times a day (BID) | ORAL | 0 refills | Status: AC

## 2022-08-11 NOTE — Discharge Instructions (Signed)
Stop by the pharmacy to pick up your prescriptions.  Follow up with your primary care provider as needed.  Your home test for COVID-19 was positive, meaning that you were infected with the novel coronavirus and could give the germ to others.  Please continue isolation at home for at least 5 days (ends on Tuesday) since the start of your symptoms. Once you complete your 5 day quarantine, you may return to normal activities as long as you've not had a fever for over 24 hours(without taking fever reducing medicine) and your symptoms are improving. Be sure to wear a mask until Day 11 (next Sunday).   Please continue good preventive care measures, including:  frequent hand-washing, avoid touching your face, cover coughs/sneezes, stay out of crowds and keep a 6 foot distance from others.  Go to the nearest hospital emergency room if fever/cough/breathlessness are severe or illness seems like a threat to life.

## 2022-08-11 NOTE — ED Triage Notes (Signed)
Patient c/o cough, nasal congestion, headahce and chills that started yesterday.  Patient took home covid test yesterday and was positive.

## 2022-08-11 NOTE — ED Provider Notes (Signed)
[facility]    CSN: [id number] Arrival date & time: 08/11/22  6440      History   Chief Complaint Chief Complaint  Patient presents with   Headache    COVID +   Cough    HPI Rebecca Fowler is a 71 y.o. female.   HPI   Rebecca Fowler presents after testing positive for COVID at home.  Her grandson and her husband both have similar symptoms.  Her grandson was tested positive for COVID as well.  Her husband has no symptoms at this time.  Patient reports cough, nasal congestion, headache and chills that started yesterday.  She denies chest tightness and shortness of breath.  There has been no fever, nausea, vomiting, diarrhea.  Endorses myalgias and fatigue.    Past Medical History:  Diagnosis Date   Diabetes mellitus without complication (HCC)    Hyperlipemia    Hypertension     There are no problems to display for this patient.   History reviewed. No pertinent surgical history.  OB History     Gravida  5   Para      Term      Preterm      AB      Living  4      SAB      IAB      Ectopic      Multiple      Live Births               Home Medications    Prior to Admission medications   Medication Sig Start Date End Date Taking? Authorizing Provider  albuterol (VENTOLIN HFA) 108 (90 Base) MCG/ACT inhaler Inhale 2 puffs into the lungs every 4 (four) hours as needed for wheezing or shortness of breath. 08/11/22  Yes Louisa Favaro, DO  amLODipine (NORVASC) 10 MG tablet Take 10 mg by mouth daily. 12/28/14  Yes [provider]  aspirin 81 MG chewable tablet Chew 81 mg by mouth every morning.   Yes [provider]  atorvastatin (LIPITOR) 10 MG tablet Take 10 mg by mouth 4 (four) times a week. 11/18/15  Yes [provider]  chlorthalidone (HYGROTON) 25 MG tablet Take 25 mg by mouth every morning. 10/12/15  Yes [provider]  Cholecalciferol 125 MCG (5000 UT) TABS Take by mouth. 10/15/12  Yes [provider]  gabapentin (NEURONTIN) 100 MG capsule Take 300 mg by mouth every evening. 11/08/15  Yes [provider]  metFORMIN (GLUCOPHAGE) 1000 MG tablet Take 1,000 mg by mouth 2 (two) times daily with a meal. 09/21/15  Yes [provider]  metoprolol succinate (TOPROL-XL) 25 MG 24 hr tablet Take 25 mg by mouth daily. 09/21/15  Yes [provider]  molnupiravir EUA (LAGEVRIO) 200 MG CAPS capsule Take 4 capsules (800 mg total) by mouth 2 (two) times daily for 5 days. 08/11/22 08/16/22 Yes Roanne Haye, DO  olmesartan (BENICAR) 40 MG tablet Take 1 tablet by mouth daily. 09/07/21  Yes [provider]  promethazine-dextromethorphan (PROMETHAZINE-DM) 6.25-15 MG/5ML syrup Take 5 mLs by mouth 4 (four) times daily as needed for cough. 08/11/22  Yes Kiwana Deblasi, DO  rosuvastatin (CRESTOR) 5 MG tablet Take 1 tablet by mouth every morning. 10/07/21  Yes [provider]  Biotin 1 MG CAPS Take 1 tablet by mouth daily.    [provider]  ezetimibe (ZETIA) 10 MG tablet Take 10 mg by mouth daily. 05/21/22   [provider]  gabapentin (NEURONTIN) 300 MG capsule Take by mouth. 06/19/22   [provider]  glimepiride (AMARYL) 4 MG tablet Take 4 mg by mouth 2 (two) times daily. 10/12/15   [provider]  hydrOXYzine (ATARAX/VISTARIL) 25 MG tablet Take 25 mg by mouth every 6 (six) hours as needed. 10/20/15   [provider]  INVOKANA 100 MG TABS tablet Take 100 mg by mouth daily. 08/09/22   [provider]  lidocaine (LIDODERM) 5 % Place 1 patch onto the skin every 12 (twelve) hours. Remove & Discard patch within 12 hours or as directed by MD 10/28/21 10/28/22  Sherrie Mustache, Roselyn Bering, PA-C  losartan (COZAAR) 100 MG tablet Take 100 mg by mouth daily. 03/09/15   [provider]  naproxen (NAPROSYN) 500 MG tablet Take 500 mg by mouth 2 (two) times daily as needed. For pain. 06/15/15   [provider]   oxyCODONE-acetaminophen (PERCOCET) 5-325 MG tablet Take 1 tablet by mouth every 4 (four) hours as needed for severe pain. 10/28/21 10/28/22  Fisher, Roselyn Bering, PA-C  predniSONE (DELTASONE) 10 MG tablet Take 1 tablet (10 mg total) by mouth as directed. 10/25/21   Cuthriell, Delorise Royals, PA-C    Family History History reviewed. No pertinent family history.  Social History Social History   Tobacco Use   Smoking status: Never   Smokeless tobacco: Never  Vaping Use   Vaping Use: Never used  Substance Use Topics   Alcohol use: No   Drug use: No     Allergies   Pravastatin and Simvastatin   Review of Systems Review of Systems: negative unless otherwise stated in HPI.      Physical Exam Triage Vital Signs ED Triage Vitals  Enc Vitals Group     BP 08/11/22 1002 117/75     Pulse Rate 08/11/22 1002 76     Resp 08/11/22 1002 14     Temp 08/11/22 1002 98.6 F (37 C)     Temp Source 08/11/22 1002 Oral     SpO2 08/11/22 1002 95 %     Weight 08/11/22 0956 238 lb (108 kg)     Height 08/11/22 0956 5\' 7"  (1.702 m)     Head Circumference --      Peak Flow --      Pain Score 08/11/22 0956 2     Pain Loc --      Pain Edu? --      Excl. in GC? --    No data found.  Updated Vital Signs BP 117/75 (BP Location: Left Arm)   Pulse 76   Temp 98.6 F (37 C) (Oral)   Resp 14   Ht 5\' 7"  (1.702 m)   Wt 108 kg   SpO2 95%   BMI 37.28 kg/m   Visual Acuity Right Eye Distance:   Left Eye Distance:   Bilateral Distance:    Right Eye Near:   Left Eye Near:    Bilateral Near:     Physical Exam GEN:     alert, non-toxic appearing female in no distress     HENT:  mucus membranes moist, oropharyngeal  without lesions or  exudate, no  tonsillar hypertrophy,  mild oropharyngeal erythema, clear nasal discharge,  bilateral TM  normal EYES:   pupils equal and reactive, EOMi ,  no scleral injection NECK:  normal ROM, anterior cervical lymphadenopathy,  no meningismus   RESP:  no increased  work of breathing,  clear to auscultation bilaterally, no wheezes, rales or  rhonchi, no respiratory distress CVS:   regular rate  and rhythm Skin:   warm and dry, no rash on visible skin, normal skin turgor    UC Treatments / Results  Labs (all labs ordered are listed, but only abnormal results are displayed) Labs Reviewed  RESP PANEL BY RT-PCR (FLU A&B, COVID) ARPGX2 - Abnormal; Notable for the following components:      Result Value   SARS Coronavirus 2 by RT PCR POSITIVE (*)    All other components within normal limits    EKG   Radiology No results found.  Procedures Procedures (including critical care time)  Medications Ordered in UC Medications - No data to display  Initial Impression / Assessment and Plan / UC Course  I have reviewed the triage vital signs and the nursing notes.  Pertinent labs & imaging results that were available during my care of the patient were reviewed by me and considered in my medical decision making (see chart for details).       COVID exposure Patient is a 71 y.o. female with history of T2DM, HTN and HLD who presents after testing positive for COVID at home.   She has had COVID once before.  She is COVID vaccinated and boosted.  Overall she is well-appearing, well-hydrated and in no respiratory distress.  Discussed symptomatic treatment.  Her cough bothers her the most and gave her Promethazine DM  and albuterol to help with cough and chest tightness. Pulmonary exam she has equal aeration bilaterally, imaging deferred.    She is  interested in treatment for COVID. After shared decision making, she is interested in [place].  Rx sent to pharmacy.  Quarantine instructions provided.  ED and return precautions and understanding voiced. Discussed MDM, treatment plan and plan for follow-up with patient/parent who agrees with plan.     Final Clinical Impressions(s) / UC Diagnoses   Final diagnoses:  COVID-19     Discharge  Instructions      Stop by the pharmacy to pick up your prescriptions.  Follow up with your primary care provider as needed.  Your home test for COVID-19 was positive, meaning that you were infected with the novel coronavirus and could give the germ to others.  Please continue isolation at home for at least 5 days (ends on Tuesday) since the start of your symptoms. Once you complete your 5 day quarantine, you may return to normal activities as long as you've not had a fever for over 24 hours(without taking fever reducing medicine) and your symptoms are improving. Be sure to wear a mask until Day 11 (next Sunday).   Please continue good preventive care measures, including:  frequent hand-washing, avoid touching your face, cover coughs/sneezes, stay out of crowds and keep a 6 foot distance from others.  Go to the nearest hospital emergency room if fever/cough/breathlessness are severe or illness seems like a threat to life.     ED Prescriptions     Medication Sig Dispense Auth. Provider   molnupiravir EUA (LAGEVRIO) 200 MG CAPS capsule Take 4 capsules (800 mg total) by mouth 2 (two) times daily for 5 days. 40 capsule Copelan Maultsby, DO   albuterol (VENTOLIN HFA) 108 (90 Base) MCG/ACT inhaler Inhale 2 puffs into the lungs every 4 (four) hours as needed for wheezing or shortness of breath. 6.7 each Jaiveon Suppes, DO   promethazine-dextromethorphan (PROMETHAZINE-DM) 6.25-15 MG/5ML syrup Take 5 mLs by mouth 4 (four) times daily as needed for cough. 118 mL Yarelli Decelles, Seward Meth, DO  PDMP not reviewed this encounter.   Lyndee Hensen, DO 08/11/22 1841

## 2024-02-19 ENCOUNTER — Telehealth: Payer: Self-pay | Admitting: *Deleted

## 2024-02-19 NOTE — Telephone Encounter (Signed)
 [facility] called and wanted to know if the ref. Went through. The patient has an app for new consult tom. At 10 am

## 2024-02-20 ENCOUNTER — Ambulatory Visit
Admission: RE | Admit: 2024-02-20 | Discharge: 2024-02-20 | Disposition: A | Discharge status: Home / Self Care | Source: Ambulatory Visit | Attending: Radiation Oncology | Admitting: Radiation Oncology

## 2024-02-20 ENCOUNTER — Encounter: Payer: Self-pay | Admitting: Radiation Oncology

## 2024-02-20 VITALS — BP 116/67 | HR 86 | Temp 98.0°F | Wt 216.0 lb

## 2024-02-20 DIAGNOSIS — C50412 Malignant neoplasm of upper-outer quadrant of left female breast: Secondary | ICD-10-CM

## 2024-02-20 NOTE — Consult Note (Signed)
 NEW PATIENT EVALUATION  Name: Rebecca Fowler  MRN: [id number]  Date:   02/20/2024     DOB: 1951-08-12   This 73 y.o. 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  REFERRING PHYSICIAN: Elvia Collum, MD  CHIEF COMPLAINT: No chief complaint on file.   DIAGNOSIS: The encounter diagnosis was Malignant neoplasm of upper-outer quadrant of left breast in female, estrogen receptor positive (HCC).   PREVIOUS INVESTIGATIONS:  Mammogram report reviewed Pathology reports reviewed Clinical notes reviewed  HPI: Patient is a 73 year old female received her treatment and evaluation at [facility].  She presented with an abnormal mammogram showing a lesion in the left breast at the 3 o'clock position.  Ultrasound of axilla showed multiple benign-appearing lymph nodes with cortical thickness measuring up to 3 mm.  Ultrasound-guided biopsy was positive for invasive mammary carcinoma.  Tumor was ER/PR positive HER2/neu not overexpressed overall grade 2.  She underwent a wide local excision with no sentinel node biopsy for a 2.2 cm invasive mammary carcinoma.  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  Tumor size was 2.2 cm.  There is no lymph-vascular invasion tumor was strongly ER positive PR negative HER2/neu not overexpressed.  Left medial margin was positive and she did undergo a reexcision.  No sentinel lymph node or lymph node biopsy was obtained.  She has done well postoperatively.  She has been seen by radiation oncology at [facility] with recommendation for radiation therapy now referred closer to home at our facility.  She specifically [place] breast tenderness cough or bone pain.  PLANNED TREATMENT REGIMEN: Left breast and peripheral  lymphatic radiation  PAST MEDICAL HISTORY:  has a past medical history of Diabetes mellitus without complication (HCC), Hyperlipemia, and Hypertension.    PAST SURGICAL HISTORY: History reviewed. No pertinent surgical history.  FAMILY HISTORY: family history is not on file.  SOCIAL HISTORY:  reports that she has never smoked. She has never used smokeless tobacco. She reports that she does not drink alcohol and does not use drugs.  ALLERGIES: Pravastatin and Simvastatin  MEDICATIONS:  Current Outpatient Medications  Medication Sig Dispense Refill   albuterol (VENTOLIN HFA) 108 (90 Base) MCG/ACT inhaler Inhale 2 puffs into the lungs every 4 (four) hours as needed for wheezing or shortness of breath. 6.7 each 0   amLODipine (NORVASC) 10 MG tablet Take 10 mg by mouth daily.     aspirin 81 MG chewable tablet Chew 81 mg by mouth every morning.     atorvastatin (LIPITOR) 10 MG tablet Take 10 mg by mouth 4 (four) times a week.     Biotin 1 MG CAPS Take 1 tablet by mouth daily.     chlorthalidone (HYGROTON) 25 MG tablet Take 25 mg by mouth every morning.     Cholecalciferol 125 MCG (5000 UT) TABS Take by mouth.     ezetimibe (ZETIA) 10 MG tablet Take 10 mg by mouth daily.     gabapentin (NEURONTIN) 100 MG capsule Take 300 mg by mouth every evening.     gabapentin (NEURONTIN) 300 MG capsule Take by mouth.     glimepiride (AMARYL) 4 MG tablet Take 4 mg by mouth 2 (two) times daily.     hydrOXYzine (ATARAX/VISTARIL) 25 MG tablet Take 25 mg by mouth every  6 (six) hours as needed.     INVOKANA 100 MG TABS tablet Take 100 mg by mouth daily.     losartan (COZAAR) 100 MG tablet Take 100 mg by mouth daily.     metFORMIN (GLUCOPHAGE) 1000 MG tablet Take 1,000 mg by mouth 2 (two) times daily with a meal.     metoprolol succinate (TOPROL-XL) 25 MG 24 hr tablet Take 25 mg by mouth daily.     naproxen (NAPROSYN) 500 MG tablet Take 500 mg by mouth 2 (two) times daily as needed. For pain.     olmesartan  (BENICAR) 40 MG tablet Take 1 tablet by mouth daily.     OZEMPIC, 1 MG/DOSE, 4 MG/3ML SOPN Inject 1 mg into the skin once a week.     predniSONE (DELTASONE) 10 MG tablet Take 1 tablet (10 mg total) by mouth as directed. 42 tablet 0   promethazine-dextromethorphan (PROMETHAZINE-DM) 6.25-15 MG/5ML syrup Take 5 mLs by mouth 4 (four) times daily as needed for cough. 118 mL 0   rosuvastatin (CRESTOR) 5 MG tablet Take 1 tablet by mouth every morning.     No current facility-administered medications for this encounter.    ECOG PERFORMANCE STATUS:  0 - Asymptomatic  REVIEW OF SYSTEMS: Patient denies any weight loss, fatigue, weakness, fever, chills or night sweats. Patient denies any loss of vision, blurred vision. Patient denies any ringing  of the ears or hearing loss. No irregular heartbeat. Patient denies heart murmur or history of fainting. Patient denies any chest pain or pain radiating to her upper extremities. Patient denies any shortness of breath, difficulty breathing at night, cough or hemoptysis. Patient denies any swelling in the lower legs. Patient denies any nausea vomiting, vomiting of blood, or coffee ground material in the vomitus. Patient denies any stomach pain. Patient states has had normal bowel movements no significant constipation or diarrhea. Patient denies any dysuria, hematuria or significant nocturia. Patient denies any problems walking, swelling in the joints or loss of balance. Patient denies any skin changes, loss of hair or loss of weight. Patient denies any excessive worrying or anxiety or significant depression. Patient denies any problems with insomnia. Patient denies excessive thirst, polyuria, polydipsia. Patient denies any swollen glands, patient denies easy bruising or easy bleeding. Patient denies any recent infections, allergies or URI. Patient "s visual fields have not changed significantly in recent time.   PHYSICAL EXAM: BP 116/67   Pulse 86   Temp 98 F (36.7 C)  (Tympanic)   Wt 216 lb (98 kg)   BMI 33.83 kg/m  Patient status post wide local excision of the around the left nipple areolar complex.  No dominant masses noted in either breast.  No axillary or supraclavicular adenopathy is appreciated.  Well-developed well-nourished patient in NAD. HEENT reveals PERLA, EOMI, discs not visualized.  Oral cavity is clear. No oral mucosal lesions are identified. Neck is clear without evidence of cervical or supraclavicular adenopathy. Lungs are clear to A&P. Cardiac examination is essentially unremarkable with regular rate and rhythm without murmur rub or thrill. Abdomen is benign with no organomegaly or masses noted. Motor sensory and DTR levels are equal and symmetric in the upper and lower extremities. Cranial nerves II through XII are grossly intact. Proprioception is intact. No peripheral adenopathy or edema is identified. No motor or sensory levels are noted. Crude visual fields are within normal range.  LABORATORY DATA: Pathology reports reviewed    RADIOLOGY RESULTS: Mammogram and ultrasound reports reviewed   IMPRESSION: pT2  NX M0 invasive mammary carcinoma left breast as post wide local excision and reconstruction ER positive PR negative in 73 year old female  PLAN: This time addendum [facility] nomogram calculation of sentinel node probability of positivity showing approximately 30%.  Based on the fact no sentinel node was performed I will include left breast and peripheral lymphatic NR targeting.  Will plan on delivering 5040 cGy 28 fractions boosting her scar another 1000 cGy using photon beam therapy.  Risks and benefits of treatment including skin reaction fatigue alteration of blood counts possible inclusion of superficial lung all were discussed in detail with the patient.  I personally set up and ordered CT simulation for next week.  Patient also will be a candidate for endocrine therapy after completion of radiation.  Patient comprehends my  recommendations well.  I would like to take this opportunity to thank you for allowing me to participate in the care of your patient.Glenis Langdon, MD

## 2024-04-22 ENCOUNTER — Ambulatory Visit
Admission: RE | Admit: 2024-04-22 | Discharge: 2024-04-22 | Disposition: A | Discharge status: Home / Self Care | Source: Ambulatory Visit | Attending: Radiation Oncology | Admitting: Radiation Oncology

## 2024-04-22 DIAGNOSIS — Z170 Estrogen receptor positive status [ER+]: Secondary | ICD-10-CM | POA: Insufficient documentation

## 2024-04-22 DIAGNOSIS — Z510 Encounter for antineoplastic radiation therapy: Secondary | ICD-10-CM | POA: Insufficient documentation

## 2024-04-22 DIAGNOSIS — C50412 Malignant neoplasm of upper-outer quadrant of left female breast: Secondary | ICD-10-CM | POA: Diagnosis present

## 2024-04-24 DIAGNOSIS — Z510 Encounter for antineoplastic radiation therapy: Secondary | ICD-10-CM | POA: Diagnosis not present

## 2024-04-25 ENCOUNTER — Other Ambulatory Visit: Payer: Self-pay | Admitting: *Deleted

## 2024-04-25 DIAGNOSIS — Z170 Estrogen receptor positive status [ER+]: Secondary | ICD-10-CM

## 2024-04-29 ENCOUNTER — Ambulatory Visit
Admission: RE | Admit: 2024-04-29 | Discharge: 2024-04-29 | Disposition: A | Discharge status: Home / Self Care | Source: Ambulatory Visit | Attending: Radiation Oncology | Admitting: Radiation Oncology

## 2024-04-29 DIAGNOSIS — Z510 Encounter for antineoplastic radiation therapy: Secondary | ICD-10-CM | POA: Diagnosis not present

## 2024-04-30 ENCOUNTER — Ambulatory Visit
Admission: RE | Admit: 2024-04-30 | Discharge: 2024-04-30 | Disposition: A | Discharge status: Home / Self Care | Source: Ambulatory Visit | Attending: Radiation Oncology | Admitting: Radiation Oncology

## 2024-04-30 ENCOUNTER — Other Ambulatory Visit: Payer: Self-pay

## 2024-04-30 DIAGNOSIS — Z510 Encounter for antineoplastic radiation therapy: Secondary | ICD-10-CM | POA: Diagnosis not present

## 2024-04-30 LAB — RAD ONC ARIA SESSION SUMMARY
Course Elapsed Days: 0
Plan Fractions Treated to Date: 1
Plan Prescribed Dose Per Fraction: 1.8 Gy
Plan Total Fractions Prescribed: 28
Plan Total Prescribed Dose: 50.4 Gy
Reference Point Dosage Given to Date: 1.8 Gy
Reference Point Session Dosage Given: 1.8 Gy
Session Number: 1

## 2024-05-01 ENCOUNTER — Other Ambulatory Visit: Payer: Self-pay

## 2024-05-01 ENCOUNTER — Ambulatory Visit
Admission: RE | Admit: 2024-05-01 | Discharge: 2024-05-01 | Disposition: A | Discharge status: Home / Self Care | Source: Ambulatory Visit | Attending: Radiation Oncology | Admitting: Radiation Oncology

## 2024-05-01 DIAGNOSIS — Z510 Encounter for antineoplastic radiation therapy: Secondary | ICD-10-CM | POA: Diagnosis not present

## 2024-05-01 LAB — RAD ONC ARIA SESSION SUMMARY
Course Elapsed Days: 1
Plan Fractions Treated to Date: 2
Plan Prescribed Dose Per Fraction: 1.8 Gy
Plan Total Fractions Prescribed: 28
Plan Total Prescribed Dose: 50.4 Gy
Reference Point Dosage Given to Date: 3.6 Gy
Reference Point Session Dosage Given: 1.8 Gy
Session Number: 2

## 2024-05-02 ENCOUNTER — Ambulatory Visit
Admission: RE | Admit: 2024-05-02 | Discharge: 2024-05-02 | Disposition: A | Discharge status: Home / Self Care | Source: Ambulatory Visit | Attending: Radiation Oncology | Admitting: Radiation Oncology

## 2024-05-02 ENCOUNTER — Other Ambulatory Visit: Payer: Self-pay

## 2024-05-02 DIAGNOSIS — Z510 Encounter for antineoplastic radiation therapy: Secondary | ICD-10-CM | POA: Diagnosis not present

## 2024-05-02 LAB — RAD ONC ARIA SESSION SUMMARY
Course Elapsed Days: 2
Plan Fractions Treated to Date: 3
Plan Prescribed Dose Per Fraction: 1.8 Gy
Plan Total Fractions Prescribed: 28
Plan Total Prescribed Dose: 50.4 Gy
Reference Point Dosage Given to Date: 5.4 Gy
Reference Point Session Dosage Given: 1.8 Gy
Session Number: 3

## 2024-05-05 ENCOUNTER — Ambulatory Visit
Admission: RE | Admit: 2024-05-05 | Discharge: 2024-05-05 | Disposition: A | Discharge status: Home / Self Care | Source: Ambulatory Visit | Attending: Radiation Oncology | Admitting: Radiation Oncology

## 2024-05-05 ENCOUNTER — Other Ambulatory Visit: Payer: Self-pay

## 2024-05-05 DIAGNOSIS — Z510 Encounter for antineoplastic radiation therapy: Secondary | ICD-10-CM | POA: Diagnosis not present

## 2024-05-05 LAB — RAD ONC ARIA SESSION SUMMARY
Course Elapsed Days: 5
Plan Fractions Treated to Date: 4
Plan Prescribed Dose Per Fraction: 1.8 Gy
Plan Total Fractions Prescribed: 28
Plan Total Prescribed Dose: 50.4 Gy
Reference Point Dosage Given to Date: 7.2 Gy
Reference Point Session Dosage Given: 1.8 Gy
Session Number: 4

## 2024-05-06 ENCOUNTER — Ambulatory Visit
Admission: RE | Admit: 2024-05-06 | Discharge: 2024-05-06 | Disposition: A | Discharge status: Home / Self Care | Source: Ambulatory Visit | Attending: Radiation Oncology | Admitting: Radiation Oncology

## 2024-05-06 ENCOUNTER — Other Ambulatory Visit: Payer: Self-pay

## 2024-05-06 DIAGNOSIS — Z170 Estrogen receptor positive status [ER+]: Secondary | ICD-10-CM | POA: Diagnosis not present

## 2024-05-06 DIAGNOSIS — Z510 Encounter for antineoplastic radiation therapy: Secondary | ICD-10-CM | POA: Diagnosis present

## 2024-05-06 DIAGNOSIS — C50412 Malignant neoplasm of upper-outer quadrant of left female breast: Secondary | ICD-10-CM | POA: Diagnosis present

## 2024-05-06 LAB — RAD ONC ARIA SESSION SUMMARY
Course Elapsed Days: 6
Plan Fractions Treated to Date: 5
Plan Prescribed Dose Per Fraction: 1.8 Gy
Plan Total Fractions Prescribed: 28
Plan Total Prescribed Dose: 50.4 Gy
Reference Point Dosage Given to Date: 9 Gy
Reference Point Session Dosage Given: 1.8 Gy
Session Number: 5

## 2024-05-07 ENCOUNTER — Other Ambulatory Visit: Payer: Self-pay

## 2024-05-07 ENCOUNTER — Inpatient Hospital Stay

## 2024-05-07 ENCOUNTER — Ambulatory Visit
Admission: RE | Admit: 2024-05-07 | Discharge: 2024-05-07 | Disposition: A | Discharge status: Home / Self Care | Source: Ambulatory Visit | Attending: Radiation Oncology | Admitting: Radiation Oncology

## 2024-05-07 DIAGNOSIS — C50412 Malignant neoplasm of upper-outer quadrant of left female breast: Secondary | ICD-10-CM | POA: Insufficient documentation

## 2024-05-07 DIAGNOSIS — Z170 Estrogen receptor positive status [ER+]: Secondary | ICD-10-CM | POA: Insufficient documentation

## 2024-05-07 DIAGNOSIS — Z510 Encounter for antineoplastic radiation therapy: Secondary | ICD-10-CM | POA: Diagnosis not present

## 2024-05-07 LAB — RAD ONC ARIA SESSION SUMMARY
Course Elapsed Days: 7
Plan Fractions Treated to Date: 6
Plan Prescribed Dose Per Fraction: 1.8 Gy
Plan Total Fractions Prescribed: 28
Plan Total Prescribed Dose: 50.4 Gy
Reference Point Dosage Given to Date: 10.8 Gy
Reference Point Session Dosage Given: 1.8 Gy
Session Number: 6

## 2024-05-07 LAB — CBC (CANCER CENTER ONLY)
HCT: 41.1 % (ref 36.0–46.0)
Hemoglobin: 13.3 g/dL (ref 12.0–15.0)
MCH: 29.4 pg (ref 26.0–34.0)
MCHC: 32.4 g/dL (ref 30.0–36.0)
MCV: 90.7 fL (ref 80.0–100.0)
Platelet Count: 251 10*3/uL (ref 150–400)
RBC: 4.53 MIL/uL (ref 3.87–5.11)
RDW: 13.8 % (ref 11.5–15.5)
WBC Count: 5.8 10*3/uL (ref 4.0–10.5)
nRBC: 0 % (ref 0.0–0.2)

## 2024-05-08 ENCOUNTER — Ambulatory Visit
Admission: RE | Admit: 2024-05-08 | Discharge: 2024-05-08 | Disposition: A | Discharge status: Home / Self Care | Source: Ambulatory Visit | Attending: Radiation Oncology | Admitting: Radiation Oncology

## 2024-05-08 ENCOUNTER — Other Ambulatory Visit: Payer: Self-pay

## 2024-05-08 DIAGNOSIS — Z510 Encounter for antineoplastic radiation therapy: Secondary | ICD-10-CM | POA: Diagnosis not present

## 2024-05-08 LAB — RAD ONC ARIA SESSION SUMMARY
Course Elapsed Days: 8
Plan Fractions Treated to Date: 7
Plan Prescribed Dose Per Fraction: 1.8 Gy
Plan Total Fractions Prescribed: 28
Plan Total Prescribed Dose: 50.4 Gy
Reference Point Dosage Given to Date: 12.6 Gy
Reference Point Session Dosage Given: 1.8 Gy
Session Number: 7

## 2024-05-12 ENCOUNTER — Other Ambulatory Visit: Payer: Self-pay

## 2024-05-12 ENCOUNTER — Ambulatory Visit
Admission: RE | Admit: 2024-05-12 | Discharge: 2024-05-12 | Disposition: A | Discharge status: Home / Self Care | Source: Ambulatory Visit | Attending: Radiation Oncology | Admitting: Radiation Oncology

## 2024-05-12 DIAGNOSIS — Z510 Encounter for antineoplastic radiation therapy: Secondary | ICD-10-CM | POA: Diagnosis not present

## 2024-05-12 LAB — RAD ONC ARIA SESSION SUMMARY
Course Elapsed Days: 12
Plan Fractions Treated to Date: 8
Plan Prescribed Dose Per Fraction: 1.8 Gy
Plan Total Fractions Prescribed: 28
Plan Total Prescribed Dose: 50.4 Gy
Reference Point Dosage Given to Date: 14.4 Gy
Reference Point Session Dosage Given: 1.8 Gy
Session Number: 8

## 2024-05-13 ENCOUNTER — Ambulatory Visit
Admission: RE | Admit: 2024-05-13 | Discharge: 2024-05-13 | Disposition: A | Discharge status: Home / Self Care | Source: Ambulatory Visit | Attending: Radiation Oncology | Admitting: Radiation Oncology

## 2024-05-13 ENCOUNTER — Other Ambulatory Visit: Payer: Self-pay

## 2024-05-13 DIAGNOSIS — Z510 Encounter for antineoplastic radiation therapy: Secondary | ICD-10-CM | POA: Diagnosis not present

## 2024-05-13 LAB — RAD ONC ARIA SESSION SUMMARY
Course Elapsed Days: 13
Plan Fractions Treated to Date: 9
Plan Prescribed Dose Per Fraction: 1.8 Gy
Plan Total Fractions Prescribed: 28
Plan Total Prescribed Dose: 50.4 Gy
Reference Point Dosage Given to Date: 16.2 Gy
Reference Point Session Dosage Given: 1.8 Gy
Session Number: 9

## 2024-05-14 ENCOUNTER — Other Ambulatory Visit: Payer: Self-pay

## 2024-05-14 ENCOUNTER — Ambulatory Visit
Admission: RE | Admit: 2024-05-14 | Discharge: 2024-05-14 | Disposition: A | Discharge status: Home / Self Care | Source: Ambulatory Visit | Attending: Radiation Oncology | Admitting: Radiation Oncology

## 2024-05-14 DIAGNOSIS — Z510 Encounter for antineoplastic radiation therapy: Secondary | ICD-10-CM | POA: Diagnosis not present

## 2024-05-14 LAB — RAD ONC ARIA SESSION SUMMARY
Course Elapsed Days: 14
Plan Fractions Treated to Date: 10
Plan Prescribed Dose Per Fraction: 1.8 Gy
Plan Total Fractions Prescribed: 28
Plan Total Prescribed Dose: 50.4 Gy
Reference Point Dosage Given to Date: 18 Gy
Reference Point Session Dosage Given: 1.8 Gy
Session Number: 10

## 2024-05-15 ENCOUNTER — Other Ambulatory Visit: Payer: Self-pay

## 2024-05-15 ENCOUNTER — Ambulatory Visit
Admission: RE | Admit: 2024-05-15 | Discharge: 2024-05-15 | Disposition: A | Discharge status: Home / Self Care | Source: Ambulatory Visit | Attending: Radiation Oncology | Admitting: Radiation Oncology

## 2024-05-15 DIAGNOSIS — Z510 Encounter for antineoplastic radiation therapy: Secondary | ICD-10-CM | POA: Diagnosis not present

## 2024-05-15 LAB — RAD ONC ARIA SESSION SUMMARY
Course Elapsed Days: 15
Plan Fractions Treated to Date: 11
Plan Prescribed Dose Per Fraction: 1.8 Gy
Plan Total Fractions Prescribed: 28
Plan Total Prescribed Dose: 50.4 Gy
Reference Point Dosage Given to Date: 19.8 Gy
Reference Point Session Dosage Given: 1.8 Gy
Session Number: 11

## 2024-05-16 ENCOUNTER — Other Ambulatory Visit: Payer: Self-pay

## 2024-05-16 ENCOUNTER — Ambulatory Visit
Admission: RE | Admit: 2024-05-16 | Discharge: 2024-05-16 | Disposition: A | Discharge status: Home / Self Care | Source: Ambulatory Visit | Attending: Radiation Oncology | Admitting: Radiation Oncology

## 2024-05-16 DIAGNOSIS — Z510 Encounter for antineoplastic radiation therapy: Secondary | ICD-10-CM | POA: Diagnosis not present

## 2024-05-16 LAB — RAD ONC ARIA SESSION SUMMARY
Course Elapsed Days: 16
Plan Fractions Treated to Date: 12
Plan Prescribed Dose Per Fraction: 1.8 Gy
Plan Total Fractions Prescribed: 28
Plan Total Prescribed Dose: 50.4 Gy
Reference Point Dosage Given to Date: 21.6 Gy
Reference Point Session Dosage Given: 1.8 Gy
Session Number: 12

## 2024-05-19 ENCOUNTER — Ambulatory Visit
Admission: RE | Admit: 2024-05-19 | Discharge: 2024-05-19 | Disposition: A | Discharge status: Home / Self Care | Source: Ambulatory Visit | Attending: Radiation Oncology | Admitting: Radiation Oncology

## 2024-05-19 ENCOUNTER — Other Ambulatory Visit: Payer: Self-pay

## 2024-05-19 DIAGNOSIS — Z510 Encounter for antineoplastic radiation therapy: Secondary | ICD-10-CM | POA: Diagnosis not present

## 2024-05-19 LAB — RAD ONC ARIA SESSION SUMMARY
Course Elapsed Days: 19
Plan Fractions Treated to Date: 13
Plan Prescribed Dose Per Fraction: 1.8 Gy
Plan Total Fractions Prescribed: 28
Plan Total Prescribed Dose: 50.4 Gy
Reference Point Dosage Given to Date: 23.4 Gy
Reference Point Session Dosage Given: 1.8 Gy
Session Number: 13

## 2024-05-20 ENCOUNTER — Ambulatory Visit
Admission: RE | Admit: 2024-05-20 | Discharge: 2024-05-20 | Disposition: A | Discharge status: Home / Self Care | Source: Ambulatory Visit | Attending: Radiation Oncology | Admitting: Radiation Oncology

## 2024-05-20 ENCOUNTER — Other Ambulatory Visit: Payer: Self-pay

## 2024-05-20 DIAGNOSIS — Z510 Encounter for antineoplastic radiation therapy: Secondary | ICD-10-CM | POA: Diagnosis not present

## 2024-05-20 LAB — RAD ONC ARIA SESSION SUMMARY
Course Elapsed Days: 20
Plan Fractions Treated to Date: 14
Plan Prescribed Dose Per Fraction: 1.8 Gy
Plan Total Fractions Prescribed: 28
Plan Total Prescribed Dose: 50.4 Gy
Reference Point Dosage Given to Date: 25.2 Gy
Reference Point Session Dosage Given: 1.8 Gy
Session Number: 14

## 2024-05-21 ENCOUNTER — Other Ambulatory Visit: Payer: Self-pay

## 2024-05-21 ENCOUNTER — Inpatient Hospital Stay

## 2024-05-21 ENCOUNTER — Ambulatory Visit
Admission: RE | Admit: 2024-05-21 | Discharge: 2024-05-21 | Disposition: A | Discharge status: Home / Self Care | Source: Ambulatory Visit | Attending: Radiation Oncology | Admitting: Radiation Oncology

## 2024-05-21 DIAGNOSIS — Z170 Estrogen receptor positive status [ER+]: Secondary | ICD-10-CM

## 2024-05-21 DIAGNOSIS — Z510 Encounter for antineoplastic radiation therapy: Secondary | ICD-10-CM | POA: Diagnosis not present

## 2024-05-21 LAB — RAD ONC ARIA SESSION SUMMARY
Course Elapsed Days: 21
Plan Fractions Treated to Date: 15
Plan Prescribed Dose Per Fraction: 1.8 Gy
Plan Total Fractions Prescribed: 28
Plan Total Prescribed Dose: 50.4 Gy
Reference Point Dosage Given to Date: 27 Gy
Reference Point Session Dosage Given: 1.8 Gy
Session Number: 15

## 2024-05-21 LAB — CBC (CANCER CENTER ONLY)
HCT: 39.1 % (ref 36.0–46.0)
Hemoglobin: 13 g/dL (ref 12.0–15.0)
MCH: 30 pg (ref 26.0–34.0)
MCHC: 33.2 g/dL (ref 30.0–36.0)
MCV: 90.1 fL (ref 80.0–100.0)
Platelet Count: 248 K/uL (ref 150–400)
RBC: 4.34 MIL/uL (ref 3.87–5.11)
RDW: 13.9 % (ref 11.5–15.5)
WBC Count: 5.5 K/uL (ref 4.0–10.5)
nRBC: 0 % (ref 0.0–0.2)

## 2024-05-22 ENCOUNTER — Other Ambulatory Visit: Payer: Self-pay

## 2024-05-22 ENCOUNTER — Ambulatory Visit
Admission: RE | Admit: 2024-05-22 | Discharge: 2024-05-22 | Disposition: A | Discharge status: Home / Self Care | Source: Ambulatory Visit | Attending: Radiation Oncology | Admitting: Radiation Oncology

## 2024-05-22 DIAGNOSIS — Z510 Encounter for antineoplastic radiation therapy: Secondary | ICD-10-CM | POA: Diagnosis not present

## 2024-05-22 LAB — RAD ONC ARIA SESSION SUMMARY
Course Elapsed Days: 22
Plan Fractions Treated to Date: 16
Plan Prescribed Dose Per Fraction: 1.8 Gy
Plan Total Fractions Prescribed: 28
Plan Total Prescribed Dose: 50.4 Gy
Reference Point Dosage Given to Date: 28.8 Gy
Reference Point Session Dosage Given: 1.8 Gy
Session Number: 16

## 2024-05-23 ENCOUNTER — Other Ambulatory Visit: Payer: Self-pay

## 2024-05-23 ENCOUNTER — Ambulatory Visit
Admission: RE | Admit: 2024-05-23 | Discharge: 2024-05-23 | Disposition: A | Discharge status: Home / Self Care | Source: Ambulatory Visit | Attending: Radiation Oncology | Admitting: Radiation Oncology

## 2024-05-23 DIAGNOSIS — Z510 Encounter for antineoplastic radiation therapy: Secondary | ICD-10-CM | POA: Diagnosis not present

## 2024-05-23 LAB — RAD ONC ARIA SESSION SUMMARY
Course Elapsed Days: 23
Plan Fractions Treated to Date: 17
Plan Prescribed Dose Per Fraction: 1.8 Gy
Plan Total Fractions Prescribed: 28
Plan Total Prescribed Dose: 50.4 Gy
Reference Point Dosage Given to Date: 30.6 Gy
Reference Point Session Dosage Given: 1.8 Gy
Session Number: 17

## 2024-05-26 ENCOUNTER — Ambulatory Visit
Admission: RE | Admit: 2024-05-26 | Discharge: 2024-05-26 | Disposition: A | Discharge status: Home / Self Care | Source: Ambulatory Visit | Attending: Radiation Oncology | Admitting: Radiation Oncology

## 2024-05-26 ENCOUNTER — Other Ambulatory Visit: Payer: Self-pay

## 2024-05-26 DIAGNOSIS — Z510 Encounter for antineoplastic radiation therapy: Secondary | ICD-10-CM | POA: Diagnosis not present

## 2024-05-26 LAB — RAD ONC ARIA SESSION SUMMARY
Course Elapsed Days: 26
Plan Fractions Treated to Date: 18
Plan Prescribed Dose Per Fraction: 1.8 Gy
Plan Total Fractions Prescribed: 28
Plan Total Prescribed Dose: 50.4 Gy
Reference Point Dosage Given to Date: 32.4 Gy
Reference Point Session Dosage Given: 1.8 Gy
Session Number: 18

## 2024-05-27 ENCOUNTER — Ambulatory Visit
Admission: RE | Admit: 2024-05-27 | Discharge: 2024-05-27 | Discharge status: Home / Self Care | Source: Ambulatory Visit | Attending: Radiation Oncology | Admitting: Radiation Oncology

## 2024-05-27 ENCOUNTER — Other Ambulatory Visit: Payer: Self-pay

## 2024-05-27 DIAGNOSIS — Z510 Encounter for antineoplastic radiation therapy: Secondary | ICD-10-CM | POA: Diagnosis not present

## 2024-05-27 LAB — RAD ONC ARIA SESSION SUMMARY
Course Elapsed Days: 27
Plan Fractions Treated to Date: 19
Plan Prescribed Dose Per Fraction: 1.8 Gy
Plan Total Fractions Prescribed: 28
Plan Total Prescribed Dose: 50.4 Gy
Reference Point Dosage Given to Date: 34.2 Gy
Reference Point Session Dosage Given: 1.8 Gy
Session Number: 19

## 2024-05-28 ENCOUNTER — Other Ambulatory Visit: Payer: Self-pay

## 2024-05-28 ENCOUNTER — Ambulatory Visit
Admission: RE | Admit: 2024-05-28 | Discharge: 2024-05-28 | Disposition: A | Discharge status: Home / Self Care | Source: Ambulatory Visit | Attending: Radiation Oncology | Admitting: Radiation Oncology

## 2024-05-28 DIAGNOSIS — Z510 Encounter for antineoplastic radiation therapy: Secondary | ICD-10-CM | POA: Diagnosis not present

## 2024-05-28 LAB — RAD ONC ARIA SESSION SUMMARY
Course Elapsed Days: 28
Plan Fractions Treated to Date: 20
Plan Prescribed Dose Per Fraction: 1.8 Gy
Plan Total Fractions Prescribed: 28
Plan Total Prescribed Dose: 50.4 Gy
Reference Point Dosage Given to Date: 36 Gy
Reference Point Session Dosage Given: 1.8 Gy
Session Number: 20

## 2024-05-29 ENCOUNTER — Ambulatory Visit
Admission: RE | Admit: 2024-05-29 | Discharge: 2024-05-29 | Disposition: A | Discharge status: Home / Self Care | Source: Ambulatory Visit | Attending: Radiation Oncology | Admitting: Radiation Oncology

## 2024-05-29 ENCOUNTER — Other Ambulatory Visit: Payer: Self-pay

## 2024-05-29 DIAGNOSIS — Z510 Encounter for antineoplastic radiation therapy: Secondary | ICD-10-CM | POA: Diagnosis not present

## 2024-05-29 LAB — RAD ONC ARIA SESSION SUMMARY
Course Elapsed Days: 29
Plan Fractions Treated to Date: 21
Plan Prescribed Dose Per Fraction: 1.8 Gy
Plan Total Fractions Prescribed: 28
Plan Total Prescribed Dose: 50.4 Gy
Reference Point Dosage Given to Date: 37.8 Gy
Reference Point Session Dosage Given: 1.8 Gy
Session Number: 21

## 2024-05-30 ENCOUNTER — Other Ambulatory Visit: Payer: Self-pay

## 2024-05-30 ENCOUNTER — Ambulatory Visit
Admission: RE | Admit: 2024-05-30 | Discharge: 2024-05-30 | Disposition: A | Discharge status: Home / Self Care | Source: Ambulatory Visit | Attending: Radiation Oncology | Admitting: Radiation Oncology

## 2024-05-30 DIAGNOSIS — Z510 Encounter for antineoplastic radiation therapy: Secondary | ICD-10-CM | POA: Diagnosis not present

## 2024-05-30 LAB — RAD ONC ARIA SESSION SUMMARY
Course Elapsed Days: 30
Plan Fractions Treated to Date: 22
Plan Prescribed Dose Per Fraction: 1.8 Gy
Plan Total Fractions Prescribed: 28
Plan Total Prescribed Dose: 50.4 Gy
Reference Point Dosage Given to Date: 39.6 Gy
Reference Point Session Dosage Given: 1.8 Gy
Session Number: 22

## 2024-06-02 ENCOUNTER — Ambulatory Visit
Admission: RE | Admit: 2024-06-02 | Discharge: 2024-06-02 | Disposition: A | Discharge status: Home / Self Care | Source: Ambulatory Visit | Attending: Radiation Oncology | Admitting: Radiation Oncology

## 2024-06-02 ENCOUNTER — Other Ambulatory Visit: Payer: Self-pay

## 2024-06-02 DIAGNOSIS — Z510 Encounter for antineoplastic radiation therapy: Secondary | ICD-10-CM | POA: Diagnosis not present

## 2024-06-02 LAB — RAD ONC ARIA SESSION SUMMARY
Course Elapsed Days: 33
Plan Fractions Treated to Date: 23
Plan Prescribed Dose Per Fraction: 1.8 Gy
Plan Total Fractions Prescribed: 28
Plan Total Prescribed Dose: 50.4 Gy
Reference Point Dosage Given to Date: 41.4 Gy
Reference Point Session Dosage Given: 1.8 Gy
Session Number: 23

## 2024-06-03 ENCOUNTER — Ambulatory Visit
Admission: RE | Admit: 2024-06-03 | Discharge: 2024-06-03 | Disposition: A | Discharge status: Home / Self Care | Source: Ambulatory Visit | Attending: Radiation Oncology | Admitting: Radiation Oncology

## 2024-06-03 ENCOUNTER — Other Ambulatory Visit: Payer: Self-pay

## 2024-06-03 DIAGNOSIS — Z510 Encounter for antineoplastic radiation therapy: Secondary | ICD-10-CM | POA: Diagnosis not present

## 2024-06-03 LAB — RAD ONC ARIA SESSION SUMMARY
Course Elapsed Days: 34
Plan Fractions Treated to Date: 24
Plan Prescribed Dose Per Fraction: 1.8 Gy
Plan Total Fractions Prescribed: 28
Plan Total Prescribed Dose: 50.4 Gy
Reference Point Dosage Given to Date: 43.2 Gy
Reference Point Session Dosage Given: 1.8 Gy
Session Number: 24

## 2024-06-04 ENCOUNTER — Other Ambulatory Visit: Payer: Self-pay

## 2024-06-04 ENCOUNTER — Ambulatory Visit
Admission: RE | Admit: 2024-06-04 | Discharge: 2024-06-04 | Disposition: A | Discharge status: Home / Self Care | Source: Ambulatory Visit | Attending: Radiation Oncology | Admitting: Radiation Oncology

## 2024-06-04 ENCOUNTER — Inpatient Hospital Stay

## 2024-06-04 DIAGNOSIS — C50412 Malignant neoplasm of upper-outer quadrant of left female breast: Secondary | ICD-10-CM

## 2024-06-04 DIAGNOSIS — Z510 Encounter for antineoplastic radiation therapy: Secondary | ICD-10-CM | POA: Diagnosis not present

## 2024-06-04 LAB — CBC (CANCER CENTER ONLY)
HCT: 40.5 % (ref 36.0–46.0)
Hemoglobin: 13.3 g/dL (ref 12.0–15.0)
MCH: 29.7 pg (ref 26.0–34.0)
MCHC: 32.8 g/dL (ref 30.0–36.0)
MCV: 90.4 fL (ref 80.0–100.0)
Platelet Count: 246 K/uL (ref 150–400)
RBC: 4.48 MIL/uL (ref 3.87–5.11)
RDW: 13.8 % (ref 11.5–15.5)
WBC Count: 5.9 K/uL (ref 4.0–10.5)
nRBC: 0 % (ref 0.0–0.2)

## 2024-06-04 LAB — RAD ONC ARIA SESSION SUMMARY
Course Elapsed Days: 35
Plan Fractions Treated to Date: 25
Plan Prescribed Dose Per Fraction: 1.8 Gy
Plan Total Fractions Prescribed: 28
Plan Total Prescribed Dose: 50.4 Gy
Reference Point Dosage Given to Date: 45 Gy
Reference Point Session Dosage Given: 1.8 Gy
Session Number: 25

## 2024-06-05 ENCOUNTER — Ambulatory Visit
Admission: RE | Admit: 2024-06-05 | Discharge: 2024-06-05 | Disposition: A | Discharge status: Home / Self Care | Source: Ambulatory Visit | Attending: Radiation Oncology | Admitting: Radiation Oncology

## 2024-06-05 ENCOUNTER — Other Ambulatory Visit: Payer: Self-pay | Admitting: *Deleted

## 2024-06-05 ENCOUNTER — Other Ambulatory Visit: Payer: Self-pay

## 2024-06-05 DIAGNOSIS — Z510 Encounter for antineoplastic radiation therapy: Secondary | ICD-10-CM | POA: Diagnosis not present

## 2024-06-05 LAB — RAD ONC ARIA SESSION SUMMARY
Course Elapsed Days: 36
Plan Fractions Treated to Date: 26
Plan Prescribed Dose Per Fraction: 1.8 Gy
Plan Total Fractions Prescribed: 28
Plan Total Prescribed Dose: 50.4 Gy
Reference Point Dosage Given to Date: 46.8 Gy
Reference Point Session Dosage Given: 1.8 Gy
Session Number: 26

## 2024-06-05 MED ORDER — SILVER SULFADIAZINE 1 % EX CREA: 1.0000 | TOPICAL_CREAM | Freq: Two times a day (BID) | CUTANEOUS | 1 refills | Status: AC

## 2024-06-06 ENCOUNTER — Ambulatory Visit

## 2024-06-09 ENCOUNTER — Ambulatory Visit
Admission: RE | Admit: 2024-06-09 | Discharge: 2024-06-09 | Disposition: A | Discharge status: Home / Self Care | Source: Ambulatory Visit | Attending: Radiation Oncology | Admitting: Radiation Oncology

## 2024-06-09 ENCOUNTER — Ambulatory Visit

## 2024-06-09 DIAGNOSIS — C50412 Malignant neoplasm of upper-outer quadrant of left female breast: Secondary | ICD-10-CM | POA: Insufficient documentation

## 2024-06-09 DIAGNOSIS — Z510 Encounter for antineoplastic radiation therapy: Secondary | ICD-10-CM | POA: Diagnosis present

## 2024-06-09 DIAGNOSIS — Z170 Estrogen receptor positive status [ER+]: Secondary | ICD-10-CM | POA: Diagnosis not present

## 2024-06-10 ENCOUNTER — Other Ambulatory Visit: Payer: Self-pay

## 2024-06-10 ENCOUNTER — Ambulatory Visit
Admission: RE | Admit: 2024-06-10 | Discharge: 2024-06-10 | Disposition: A | Discharge status: Home / Self Care | Source: Ambulatory Visit | Attending: Radiation Oncology | Admitting: Radiation Oncology

## 2024-06-10 DIAGNOSIS — Z510 Encounter for antineoplastic radiation therapy: Secondary | ICD-10-CM | POA: Diagnosis not present

## 2024-06-10 LAB — RAD ONC ARIA SESSION SUMMARY
Course Elapsed Days: 41
Plan Fractions Treated to Date: 2
Plan Prescribed Dose Per Fraction: 2 Gy
Plan Total Fractions Prescribed: 5
Plan Total Prescribed Dose: 10 Gy
Reference Point Dosage Given to Date: 4 Gy
Reference Point Session Dosage Given: 4 Gy
Session Number: 27

## 2024-06-11 ENCOUNTER — Other Ambulatory Visit: Payer: Self-pay

## 2024-06-11 ENCOUNTER — Ambulatory Visit
Admission: RE | Admit: 2024-06-11 | Discharge: 2024-06-11 | Disposition: A | Discharge status: Home / Self Care | Source: Ambulatory Visit | Attending: Radiation Oncology | Admitting: Radiation Oncology

## 2024-06-11 DIAGNOSIS — Z510 Encounter for antineoplastic radiation therapy: Secondary | ICD-10-CM | POA: Diagnosis not present

## 2024-06-11 LAB — RAD ONC ARIA SESSION SUMMARY
Course Elapsed Days: 42
Plan Fractions Treated to Date: 3
Plan Prescribed Dose Per Fraction: 2 Gy
Plan Total Fractions Prescribed: 5
Plan Total Prescribed Dose: 10 Gy
Reference Point Dosage Given to Date: 6 Gy
Reference Point Session Dosage Given: 2 Gy
Session Number: 28

## 2024-06-12 ENCOUNTER — Ambulatory Visit
Admission: RE | Admit: 2024-06-12 | Discharge: 2024-06-12 | Disposition: A | Discharge status: Home / Self Care | Source: Ambulatory Visit | Attending: Radiation Oncology | Admitting: Radiation Oncology

## 2024-06-12 ENCOUNTER — Other Ambulatory Visit: Payer: Self-pay

## 2024-06-12 DIAGNOSIS — Z510 Encounter for antineoplastic radiation therapy: Secondary | ICD-10-CM | POA: Diagnosis not present

## 2024-06-12 LAB — RAD ONC ARIA SESSION SUMMARY
Course Elapsed Days: 43
Plan Fractions Treated to Date: 4
Plan Prescribed Dose Per Fraction: 2 Gy
Plan Total Fractions Prescribed: 5
Plan Total Prescribed Dose: 10 Gy
Reference Point Dosage Given to Date: 8 Gy
Reference Point Session Dosage Given: 2 Gy
Session Number: 29

## 2024-06-13 ENCOUNTER — Other Ambulatory Visit: Payer: Self-pay

## 2024-06-13 ENCOUNTER — Ambulatory Visit
Admission: RE | Admit: 2024-06-13 | Discharge: 2024-06-13 | Disposition: A | Discharge status: Home / Self Care | Source: Ambulatory Visit | Attending: Radiation Oncology | Admitting: Radiation Oncology

## 2024-06-13 DIAGNOSIS — Z510 Encounter for antineoplastic radiation therapy: Secondary | ICD-10-CM | POA: Diagnosis not present

## 2024-06-13 LAB — RAD ONC ARIA SESSION SUMMARY
Course Elapsed Days: 44
Plan Fractions Treated to Date: 5
Plan Prescribed Dose Per Fraction: 2 Gy
Plan Total Fractions Prescribed: 5
Plan Total Prescribed Dose: 10 Gy
Reference Point Dosage Given to Date: 10 Gy
Reference Point Session Dosage Given: 2 Gy
Session Number: 30

## 2024-06-16 ENCOUNTER — Ambulatory Visit: Admission: RE | Admit: 2024-06-16 | Source: Ambulatory Visit

## 2024-06-16 ENCOUNTER — Ambulatory Visit

## 2024-06-17 ENCOUNTER — Ambulatory Visit

## 2024-06-18 ENCOUNTER — Ambulatory Visit

## 2024-06-19 ENCOUNTER — Ambulatory Visit
Admission: RE | Admit: 2024-06-19 | Discharge: 2024-06-19 | Disposition: A | Discharge status: Home / Self Care | Source: Ambulatory Visit | Attending: Radiation Oncology | Admitting: Radiation Oncology

## 2024-06-19 ENCOUNTER — Other Ambulatory Visit: Payer: Self-pay

## 2024-06-19 DIAGNOSIS — Z510 Encounter for antineoplastic radiation therapy: Secondary | ICD-10-CM | POA: Diagnosis not present

## 2024-06-19 LAB — RAD ONC ARIA SESSION SUMMARY
Course Elapsed Days: 50
Plan Fractions Treated to Date: 27
Plan Prescribed Dose Per Fraction: 1.8 Gy
Plan Total Fractions Prescribed: 28
Plan Total Prescribed Dose: 50.4 Gy
Reference Point Dosage Given to Date: 48.6 Gy
Reference Point Session Dosage Given: 1.8 Gy
Session Number: 31

## 2024-06-20 ENCOUNTER — Other Ambulatory Visit: Payer: Self-pay

## 2024-06-20 ENCOUNTER — Ambulatory Visit
Admission: RE | Admit: 2024-06-20 | Discharge: 2024-06-20 | Disposition: A | Discharge status: Home / Self Care | Source: Ambulatory Visit | Attending: Radiation Oncology | Admitting: Radiation Oncology

## 2024-06-20 DIAGNOSIS — Z510 Encounter for antineoplastic radiation therapy: Secondary | ICD-10-CM | POA: Diagnosis not present

## 2024-06-20 LAB — RAD ONC ARIA SESSION SUMMARY
Course Elapsed Days: 51
Plan Fractions Treated to Date: 28
Plan Prescribed Dose Per Fraction: 1.8 Gy
Plan Total Fractions Prescribed: 28
Plan Total Prescribed Dose: 50.4 Gy
Reference Point Dosage Given to Date: 50.4 Gy
Reference Point Session Dosage Given: 1.8 Gy
Session Number: 32

## 2024-06-23 NOTE — Radiation Completion Notes (Signed)
 Patient Name: Rebecca Fowler, Rebecca Fowler MRN: [id number] Date of Birth: 1950/11/21 Referring Physician: MARCO ALEMAN, M.D. Date of Service: 2024-06-23 Radiation Oncologist: Marcey Penton, M.D. [facility]                             RADIATION ONCOLOGY END OF TREATMENT NOTE     Diagnosis: C50.412 Malignant neoplasm of upper-outer quadrant of left female breast Intent: Curative     HPI: Patient is a 74 year old female received her treatment and evaluation at [facility].  She presented with an abnormal mammogram showing a lesion in the left breast at the 3 o'clock position.  Ultrasound of axilla showed multiple benign-appearing lymph nodes with cortical thickness measuring up to 3 mm.  Ultrasound-guided biopsy was positive for invasive mammary carcinoma.  Tumor was ER/PR positive HER2/neu not overexpressed overall grade 2.  She underwent a wide local excision with no sentinel node biopsy for a 2.2 cm invasive mammary carcinoma.  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  Tumor size was 2.2 cm.  There is no lymph-vascular invasion tumor was strongly ER positive PR negative HER2/neu not overexpressed.  Left medial margin was positive and she did undergo a reexcision.  No sentinel lymph node or lymph node biopsy was obtained.  She has done well postoperatively.  She has been seen by radiation oncology at [facility] with recommendation for radiation therapy now referred closer to home at our facility.  She specifically [place] breast tenderness cough or bone pain.      ==========DELIVERED PLANS==========  First Treatment Date: 2024-04-30 Last Treatment Date: 2024-06-20   Plan Name: Breast_L_SCLV Site: Breast, Left Technique: 3D Mode: Photon Dose Per Fraction: 1.8 Gy Prescribed Dose (Delivered / Prescribed): 50.4 Gy / 50.4 Gy Prescribed Fxs (Delivered / Prescribed): 28 / 28   Plan Name: Breast_L_Bst Site: Breast,  Left Technique: 3D Mode: Photon Dose Per Fraction: 2 Gy Prescribed Dose (Delivered / Prescribed): 10 Gy / 10 Gy Prescribed Fxs (Delivered / Prescribed): 5 / 5     ==========ON TREATMENT VISIT DATES========== 2024-05-06, 2024-05-13, 2024-05-20, 2024-05-27, 2024-06-03, 2024-06-10, 2024-06-12, 2024-06-16     ==========UPCOMING VISITS========== 07/16/2024 [facility] RAD ONCOLOGY FOLLOW UP 30 Chrystal, Marcey, MD        ==========APPENDIX - ON TREATMENT VISIT NOTES==========   See weekly On Treatment Notes in Epic for details in the Media tab (listed as Progress notes on the On Treatment Visit Dates listed above).

## 2024-07-16 ENCOUNTER — Encounter: Payer: Self-pay | Admitting: Radiation Oncology

## 2024-07-16 ENCOUNTER — Ambulatory Visit
Admission: RE | Admit: 2024-07-16 | Discharge: 2024-07-16 | Disposition: A | Discharge status: Home / Self Care | Source: Ambulatory Visit | Attending: Radiation Oncology | Admitting: Radiation Oncology

## 2024-07-16 VITALS — BP 122/71 | HR 74 | Temp 98.0°F | Resp 15 | Ht 67.5 in | Wt 208.0 lb

## 2024-07-16 DIAGNOSIS — Z170 Estrogen receptor positive status [ER+]: Secondary | ICD-10-CM | POA: Diagnosis present

## 2024-07-16 DIAGNOSIS — C50412 Malignant neoplasm of upper-outer quadrant of left female breast: Secondary | ICD-10-CM | POA: Diagnosis present

## 2024-07-16 NOTE — Progress Notes (Signed)
**Note Rebecca-Identified via Obfuscation**  Radiation Oncology Follow up Note  Name: Rebecca Fowler   Date:   07/16/2024 MRN:  [id number] DOB: 27-Dec-1950    This 73 y.o. female presents to the clinic today for 1 month follow-up status post whole breast radiation to her left breast for pathologic stage T2 NX invasive mammary carcinoma with lobular features ER/PR positive.  REFERRING PROVIDER: Manuela Sharrell LABOR, MD  HPI: Patient is a 73 year old female now out 1 month having completed whole breast radiation to her left breast for a pathologic T2 NX ER positive invasive mammary carcinoma lobular features.  Seen today in routine follow-up she is doing well.  Specifically denies breast tenderness cough or bone pain.  She has not yet started endocrine therapy is waiting for a prescription from medical oncology..  COMPLICATIONS OF TREATMENT: none  FOLLOW UP COMPLIANCE: keeps appointments   PHYSICAL EXAM:  BP 122/71   Pulse 74   Temp 98 F (36.7 C)   Resp 15   Ht 5' 7.5 (1.715 m)   Wt 208 lb (94.3 kg)   BMI 32.10 kg/m  Lungs are clear to A&P cardiac examination essentially unremarkable with regular rate and rhythm. No dominant mass or nodularity is noted in either breast in 2 positions examined. Incision is well-healed. No axillary or supraclavicular adenopathy is appreciated. Cosmetic result is excellent.  Still some slight hyperpigmentation of the skin which have assured the patient will clear over the next several months.  Well-developed well-nourished patient in NAD. HEENT reveals PERLA, EOMI, discs not visualized.  Oral cavity is clear. No oral mucosal lesions are identified. Neck is clear without evidence of cervical or supraclavicular adenopathy. Lungs are clear to A&P. Cardiac examination is essentially unremarkable with regular rate and rhythm without murmur rub or thrill. Abdomen is benign with no organomegaly or masses noted. Motor sensory and DTR levels are equal and symmetric in the upper and lower extremities. Cranial nerves  II through XII are grossly intact. Proprioception is intact. No peripheral adenopathy or edema is identified. No motor or sensory levels are noted. Crude visual fields are within normal range. RADIOLOGY RESULTS: No current films for review  PLAN: Present time patient is doing well very low side effect profile and excellent cosmetic result from whole breast radiation.  Am pleased with her overall progress.  She will be contacting medical oncology for her endocrine therapy prescription.  I have asked to see her back in 6 months for follow-up.  Patient knows to call with any concerns.  I would like to take this opportunity to thank you for allowing me to participate in the care of your patient.SABRA Marcey Penton, MD

## 2024-07-28 ENCOUNTER — Other Ambulatory Visit: Payer: Self-pay | Admitting: Urology

## 2025-01-21 ENCOUNTER — Ambulatory Visit: Admitting: Radiation Oncology
